# Patient Record
Sex: Female | Born: 1960
Health system: Southern US, Community
[De-identification: ages and names within clinical notes are randomized; demographics above are authoritative.]

## PROBLEM LIST (undated history)

## (undated) DIAGNOSIS — R519 Headache, unspecified: Secondary | ICD-10-CM

## (undated) DIAGNOSIS — Z780 Asymptomatic menopausal state: Secondary | ICD-10-CM

## (undated) DIAGNOSIS — R51 Headache: Secondary | ICD-10-CM

## (undated) DIAGNOSIS — S83249A Other tear of medial meniscus, current injury, unspecified knee, initial encounter: Secondary | ICD-10-CM

## (undated) DIAGNOSIS — G4700 Insomnia, unspecified: Secondary | ICD-10-CM

## (undated) DIAGNOSIS — Z973 Presence of spectacles and contact lenses: Secondary | ICD-10-CM

## (undated) DIAGNOSIS — T7840XA Allergy, unspecified, initial encounter: Secondary | ICD-10-CM

## (undated) DIAGNOSIS — E785 Hyperlipidemia, unspecified: Secondary | ICD-10-CM

## (undated) HISTORY — DX: Hyperlipidemia, unspecified: E78.5

## (undated) HISTORY — PX: COLONOSCOPY: SHX174

## (undated) HISTORY — DX: Allergy, unspecified, initial encounter: T78.40XA

## (undated) HISTORY — DX: Asymptomatic menopausal state: Z78.0

---
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
## 1982-09-11 HISTORY — PX: BUNIONECTOMY: SHX129

## 1999-03-24 ENCOUNTER — Other Ambulatory Visit: Admission: RE | Admit: 1999-03-24 | Discharge: 1999-03-24 | Payer: Self-pay | Admitting: Obstetrics and Gynecology

## 2000-01-12 ENCOUNTER — Encounter: Admission: RE | Admit: 2000-01-12 | Discharge: 2000-01-12 | Payer: Self-pay | Admitting: Urology

## 2000-01-12 ENCOUNTER — Encounter: Payer: Self-pay | Admitting: Urology

## 2000-05-03 ENCOUNTER — Other Ambulatory Visit: Admission: RE | Admit: 2000-05-03 | Discharge: 2000-05-03 | Payer: Self-pay | Admitting: Obstetrics and Gynecology

## 2001-05-09 ENCOUNTER — Other Ambulatory Visit: Admission: RE | Admit: 2001-05-09 | Discharge: 2001-05-09 | Payer: Self-pay | Admitting: Obstetrics and Gynecology

## 2002-11-04 ENCOUNTER — Other Ambulatory Visit: Admission: RE | Admit: 2002-11-04 | Discharge: 2002-11-04 | Payer: Self-pay | Admitting: Obstetrics and Gynecology

## 2003-11-24 ENCOUNTER — Other Ambulatory Visit: Admission: RE | Admit: 2003-11-24 | Discharge: 2003-11-24 | Payer: Self-pay | Admitting: Obstetrics and Gynecology

## 2005-03-21 ENCOUNTER — Other Ambulatory Visit: Admission: RE | Admit: 2005-03-21 | Discharge: 2005-03-21 | Payer: Self-pay | Admitting: Obstetrics and Gynecology

## 2008-09-08 ENCOUNTER — Ambulatory Visit (HOSPITAL_COMMUNITY): Admission: RE | Admit: 2008-09-08 | Discharge: 2008-09-08 | Payer: Self-pay | Admitting: Obstetrics and Gynecology

## 2009-04-18 ENCOUNTER — Emergency Department (HOSPITAL_COMMUNITY): Admission: EM | Admit: 2009-04-18 | Discharge: 2009-04-18 | Payer: Self-pay | Admitting: Emergency Medicine

## 2009-11-18 ENCOUNTER — Ambulatory Visit: Payer: Self-pay | Admitting: Vascular Surgery

## 2010-10-02 ENCOUNTER — Encounter: Payer: Self-pay | Admitting: Obstetrics and Gynecology

## 2011-01-24 NOTE — Assessment & Plan Note (Signed)
OFFICE VISIT   PILAR, WESTERGAARD  DOB:  1961-04-14                                       11/18/2009  [id number]#:[id number]   I briefly saw the patient in the office today along with Clementeen Hoof to  evaluate her varicose veins of both lower extremities.  She had some  small varicose veins bilaterally and particularly on the right leg some  of her varicosities appeared to be under elevated venous pressure.  She  had varicose vein in particular on the anterior lateral aspect of her  right leg which was quite distended.  I felt that there was evidence  that she may have reflux in the saphenous vein contributing to this and  therefore we obtained a venous duplex scan to look for reflux.  Venous  duplex scan did not show any evidence of reflux in the deep system.  In  addition there was no evidence of DVT.  Likewise the saphenous veins and  lesser saphenous veins appeared to be competent.  These results were  discussed with the patient.  She has undergone sclerotherapy today and  will be followed closely in the vein center.     Di Kindle. Edilia Bo, M.D.  Electronically Signed   CSD/MEDQ  D:  11/18/2009  T:  11/19/2009  Job:  [id number]

## 2011-01-24 NOTE — Procedures (Signed)
LOWER EXTREMITY VENOUS REFLUX EXAM   INDICATION:  Lower extremity varicose veins.   EXAM:  Using color-flow imaging and pulse Doppler spectral analysis, the  bilateral common femoral, superficial femoral, popliteal, posterior  tibial, greater and lesser saphenous veins were evaluated.  There is no  evidence suggesting deep venous insufficiency in the bilateral lower  extremities.   The bilateral saphenofemoral junctions and greater saphenous veins are  competent.   The bilateral proximal short saphenous veins demonstrate competency.   GSV Diameter (used if found to be incompetent only)                                            Right    Left  Proximal Greater Saphenous Vein           cm       cm  Proximal-to-mid-thigh                     cm       cm  Mid thigh                                 cm       cm  Mid-distal thigh                          cm       cm  Distal thigh                              cm       cm  Knee                                      cm       cm   IMPRESSION:  1. No reflux is noted in the bilateral greater and lesser saphenous      veins.  2. The bilateral greater saphenous veins are not tortuous.  3. The deep venous systems are competent.   ___________________________________________  Quita Skye. Hart Rochester, M.D.   CH/MEDQ  D:  11/22/2009  T:  11/22/2009  Job:  [id number]

## 2011-07-11 ENCOUNTER — Encounter: Payer: Self-pay | Admitting: Internal Medicine

## 2011-07-11 ENCOUNTER — Ambulatory Visit (INDEPENDENT_AMBULATORY_CARE_PROVIDER_SITE_OTHER): Payer: 59 | Admitting: Internal Medicine

## 2011-07-11 DIAGNOSIS — G4700 Insomnia, unspecified: Secondary | ICD-10-CM

## 2011-07-11 DIAGNOSIS — N951 Menopausal and female climacteric states: Secondary | ICD-10-CM

## 2011-07-11 DIAGNOSIS — Z780 Asymptomatic menopausal state: Secondary | ICD-10-CM

## 2011-07-11 MED ORDER — ZOLPIDEM TARTRATE 10 MG PO TABS: 10.0000 mg | ORAL_TABLET | Freq: Every evening | ORAL | Status: DC | PRN

## 2011-07-11 MED ORDER — CLONIDINE HCL 0.1 MG PO TABS: ORAL_TABLET | ORAL | Status: AC

## 2011-07-11 NOTE — Patient Instructions (Signed)
Take clonidine at bedtime,  Get out of bed slowly the first week on this medication  See me in January 2013

## 2011-07-12 ENCOUNTER — Telehealth: Payer: Self-pay | Admitting: Internal Medicine

## 2011-07-12 ENCOUNTER — Encounter: Payer: Self-pay | Admitting: Internal Medicine

## 2011-07-12 DIAGNOSIS — Z780 Asymptomatic menopausal state: Secondary | ICD-10-CM | POA: Insufficient documentation

## 2011-07-12 DIAGNOSIS — N951 Menopausal and female climacteric states: Secondary | ICD-10-CM | POA: Insufficient documentation

## 2011-07-12 DIAGNOSIS — T7840XA Allergy, unspecified, initial encounter: Secondary | ICD-10-CM | POA: Insufficient documentation

## 2011-07-12 DIAGNOSIS — E785 Hyperlipidemia, unspecified: Secondary | ICD-10-CM | POA: Insufficient documentation

## 2011-07-12 NOTE — Telephone Encounter (Signed)
Gavin Pound,  Please copy my note to sent to Dr. Timothy Lasso and place on my desk so I can write note to him  Thanks

## 2011-07-12 NOTE — Telephone Encounter (Signed)
Printed, placed on DDS desk

## 2011-07-12 NOTE — Progress Notes (Signed)
Subjective:    Patient ID: Molly Vance, female    DOB: 09-Jan-1961, 50 y.o.   MRN: [id number]  HPI  New pt here for consult regarding significant menopausal symptoms.  Primary MD.  Dr Timothy Lasso, Gyn:  Dr. Arelia Sneddon.  PMH of allergic rhinitis, mild hypelipdemia and significant menopausal symtpoms of multiple day and night-time vasomotor flushes, difficulty sleeping at times due to night-time flushes,  Low libido, weight gain and memory difficulties.  She currently is on Estrace vaginal creme twice weekly and recently given testosterone creme by Dr. Arelia Sneddon for decreased libido.  She feels the testosterone has helped minimally.  She reports no personal history of DVT, PE or hypercoagulable state,  She has a sister with breast CA,   BRCA status unknown, no personal or FH of ovarian or uterine CA,  Mother has Htn but no personal of FH MI. She has multiple questions if it is safe for her to take bio-identical HT and questions about hormone pellets.  She has tried OTC black cohosh, estroven, for flushes,  And melatonin and valerien for sleep all to no avail.  She does state the low dose Ambie (5 mg) that she takes for sleep is quite helpful as she will only wake up maybe one time per night  Allergies  Allergen Reactions  . Augmentin Hives  . Cefzil Swelling    lips  . Septra (Bactrim) Swelling    lips  . Neosporin (Neomycin-Polymyx-Gramicid) Rash    redness  . Polysporin (Bacitracin-Polymyxin B) Rash    redness   Past Medical History  Diagnosis Date  . Menopause   . Allergy   . Hyperlipidemia    Past Surgical History  Procedure Date  . Cesarean section 09/23/88  . Tonsillectomy 1983  . Bunionectomy 1984   History   Social History  . Marital Status: Married    Spouse Name: N/A    Number of Children: N/A  . Years of Education: N/A   Occupational History  . Not on file.   Social History Main Topics  . Smoking status: Never Smoker   . Smokeless tobacco: Never Used  . Alcohol Use:  3.6 oz/week    6 Glasses of wine per week  . Drug Use: No  . Sexually Active: Yes   Other Topics Concern  . Not on file   Social History Narrative  . No narrative on file   Family History  Problem Relation Age of Onset  . Diabetes Mother   . Hypertension Mother   . Diabetes Sister   . Diabetes Maternal Grandmother   . Breast cancer Sister   . Ulcerative colitis Sister   . Crohn's disease Sister    Patient Active Problem List  Diagnoses  . Menopause  . Allergy  . Hyperlipidemia  . Menopausal hot flushes   No current outpatient prescriptions on file prior to visit.        Review of Systems    see HPI Objective:   Physical Exam  Physical Exam  Nursing note and vitals reviewed.  Constitutional: She is oriented to person, place, and time. She appears well-developed and well-nourished.  HENT:  Head: Normocephalic and atraumatic.  Cardiovascular: Normal rate and regular rhythm. Exam reveals no gallop and no friction rub.  No murmur heard.  Pulmonary/Chest: Breath sounds normal. She has no wheezes. She has no rales.  Neurological: She is alert and oriented to person, place, and time.  Skin: Skin is warm and dry.  Psychiatric: She has a  normal mood and affect. Her behavior is normal.  Ext:  No edema        Assessment & Plan:  1)  Significant menopausal symptoms with hot flushes and insomnia:  Lengthy discussion with her regarding risks/benefits of HT and bioidenticals.  I gave her risk/benefit sheet for her to take home.  There are no comaparison RCT of first degree relatives with breast CA and the risk of cancer in individual using HT.  Breast Cancer is pt's biggest fear.  I counseled it is reasonable to start with non HT options, Effexor, Lexapro or clonidine and let her think about risking HT .  She does not wish to try antidepressant as she already has low libido.  Will Rx Clonidine 0.1 mg nightly  Counseled to arise slowly out of bed in the am for the first  week on therapy.  Pt aware that Clonidine is not FDA approved for hot flushes.  Recheck with me in 3 months,  If clonidine not effective will re-address very short term HT.    Continue Estrace vaginally for now.  Discuss with GYN when to discontinue   2)  Insomnia  Rx Ambien  10 mg #30  Pt take 5 mg only 2-3 times per week  I spent 45 mins with this pt  CC  Dr. Emmit Alexanders

## 2011-07-14 NOTE — Telephone Encounter (Signed)
Note faxed to Dr. Ferd Hibbs office

## 2011-07-19 ENCOUNTER — Encounter: Payer: Self-pay | Admitting: Internal Medicine

## 2011-09-26 ENCOUNTER — Ambulatory Visit: Payer: 59 | Admitting: Internal Medicine

## 2011-10-24 ENCOUNTER — Encounter: Payer: Self-pay | Admitting: *Deleted

## 2011-10-25 ENCOUNTER — Ambulatory Visit (INDEPENDENT_AMBULATORY_CARE_PROVIDER_SITE_OTHER): Payer: 59 | Admitting: *Deleted

## 2011-10-25 ENCOUNTER — Encounter: Payer: Self-pay | Admitting: *Deleted

## 2011-10-25 DIAGNOSIS — I781 Nevus, non-neoplastic: Secondary | ICD-10-CM

## 2011-10-25 NOTE — Progress Notes (Signed)
X=.3% Sotradecol administered with a 27g butterfly.  Patient received a total of 12cc.  Retreated this nice lady. About 50% of previously treated veins were still present. Only treated the spiders and did not treat the reticulars this time. Easy access and anticipate good results. Follow prn.  Photos: archived  Compression stockings applied: yes

## 2012-10-29 ENCOUNTER — Ambulatory Visit (INDEPENDENT_AMBULATORY_CARE_PROVIDER_SITE_OTHER): Payer: 59 | Admitting: Internal Medicine

## 2012-10-29 DIAGNOSIS — Z23 Encounter for immunization: Secondary | ICD-10-CM

## 2012-10-29 DIAGNOSIS — Z789 Other specified health status: Secondary | ICD-10-CM

## 2012-10-29 MED ORDER — MUPIROCIN CALCIUM 2 % EX CREA: TOPICAL_CREAM | Freq: Three times a day (TID) | CUTANEOUS | Status: DC

## 2012-10-29 MED ORDER — CIPROFLOXACIN HCL 500 MG PO TABS: 500.0000 mg | ORAL_TABLET | Freq: Two times a day (BID) | ORAL | Status: DC

## 2012-10-29 MED ORDER — ATOVAQUONE-PROGUANIL HCL 250-100 MG PO TABS: 1.0000 | ORAL_TABLET | Freq: Every day | ORAL | Status: DC

## 2012-10-29 NOTE — Progress Notes (Signed)
[facility] NOTE  RFV: vacation to [place] Subjective:    Patient ID: Molly Vance, female    DOB: 1961/02/04, 52 y.o.   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 All inclusive resort. No plans for adventure travel.    Review of Systems     Objective:   Physical Exam        Assessment & Plan:  Pre-travel vaccinations = hep A  Malaria prophylaxis= likely low risk at area of resort, patient interested in taking prophylaxis. Will give rx for malarone. And mosquito bite prevention.  Traveler's diarrhea = gave travel tips sheet, and rx for cipro

## 2013-11-04 ENCOUNTER — Encounter: Payer: Self-pay | Admitting: *Deleted

## 2013-11-05 ENCOUNTER — Ambulatory Visit (INDEPENDENT_AMBULATORY_CARE_PROVIDER_SITE_OTHER): Payer: Self-pay | Admitting: *Deleted

## 2013-11-05 DIAGNOSIS — I781 Nevus, non-neoplastic: Secondary | ICD-10-CM

## 2013-11-05 NOTE — Progress Notes (Signed)
X=.5% Asclera administered with a 27g butterfly.  Patient received a total of 9cc.  Cutaneous Laser:pulsed mode  810j/cm2  400 ms delay  13ms duration  0.5 spot  Total pulses: 801 Total energy 1.268  Total time:0:10  Photos: no  Compression stockings applied: yes  Cleaned up remaining blushes from previously treated areas using a combo of sclero and CL. Tol well. Follow prn. If her veins worsen, she should have another [place] and MD visit.

## 2013-12-23 ENCOUNTER — Encounter: Payer: Self-pay | Admitting: Gastroenterology

## 2014-02-03 ENCOUNTER — Ambulatory Visit (AMBULATORY_SURGERY_CENTER): Payer: Self-pay | Admitting: *Deleted

## 2014-02-03 VITALS — Ht 65.5 in | Wt 151.8 lb

## 2014-02-03 DIAGNOSIS — Z1211 Encounter for screening for malignant neoplasm of colon: Secondary | ICD-10-CM

## 2014-02-03 MED ORDER — MOVIPREP 100 G PO SOLR: ORAL | Status: DC

## 2014-02-03 NOTE — Progress Notes (Signed)
No allergies to eggs or soy. No problems with anesthesia.  Pt given Emmi instructions for colonoscopy  No oxygen use  No diet drug use  

## 2014-02-04 ENCOUNTER — Encounter: Payer: 59 | Admitting: Gastroenterology

## 2014-02-08 ENCOUNTER — Encounter: Payer: Self-pay | Admitting: Gastroenterology

## 2014-02-09 ENCOUNTER — Encounter: Payer: 59 | Admitting: Gastroenterology

## 2014-02-16 ENCOUNTER — Encounter: Payer: Self-pay | Admitting: Gastroenterology

## 2014-02-16 ENCOUNTER — Ambulatory Visit (AMBULATORY_SURGERY_CENTER): Payer: 59 | Admitting: Gastroenterology

## 2014-02-16 VITALS — BP 127/77 | HR 80 | Temp 97.3°F | Resp 16 | Ht 65.0 in | Wt 151.0 lb

## 2014-02-16 DIAGNOSIS — Z1211 Encounter for screening for malignant neoplasm of colon: Secondary | ICD-10-CM

## 2014-02-16 DIAGNOSIS — K5730 Diverticulosis of large intestine without perforation or abscess without bleeding: Secondary | ICD-10-CM

## 2014-02-16 MED ORDER — SODIUM CHLORIDE 0.9 % IV SOLN: 500.0000 mL | INTRAVENOUS | Status: DC

## 2014-02-16 NOTE — Patient Instructions (Signed)
YOU HAD AN ENDOSCOPIC PROCEDURE TODAY AT THE [facility]: Refer to the procedure report that was given to you for any specific questions about what was found during the examination.  If the procedure report does not answer your questions, please call your gastroenterologist to clarify.  If you requested that your care partner not be given the details of your procedure findings, then the procedure report has been included in a sealed envelope for you to review at your convenience later.  YOU SHOULD EXPECT: Some feelings of bloating in the abdomen. Passage of more gas than usual.  Walking can help get rid of the air that was put into your GI tract during the procedure and reduce the bloating. If you had a lower endoscopy (such as a colonoscopy or flexible sigmoidoscopy) you may notice spotting of blood in your stool or on the toilet paper. If you underwent a bowel prep for your procedure, then you may not have a normal bowel movement for a few days.  DIET: Your first meal following the procedure should be a light meal and then it is ok to progress to your normal diet.  A half-sandwich or bowl of soup is an example of a good first meal.  Heavy or fried foods are harder to digest and may make you feel nauseous or bloated.  Likewise meals heavy in dairy and vegetables can cause extra gas to form and this can also increase the bloating.  Drink plenty of fluids but you should avoid alcoholic beverages for 24 hours.  ACTIVITY: Your care partner should take you home directly after the procedure.  You should plan to take it easy, moving slowly for the rest of the day.  You can resume normal activity the day after the procedure however you should NOT DRIVE or use heavy machinery for 24 hours (because of the sedation medicines used during the test).    SYMPTOMS TO REPORT IMMEDIATELY: A gastroenterologist can be reached at any hour.  During normal business hours, 8:30 AM to 5:00 PM Monday through Friday,  call [phone].  After hours and on weekends, please call the GI answering service at [phone] who will take a message and have the physician on call contact you.   Following lower endoscopy (colonoscopy or flexible sigmoidoscopy):  Excessive amounts of blood in the stool  Significant tenderness or worsening of abdominal pains  Swelling of the abdomen that is new, acute  Fever of 100F or higher    FOLLOW UP: If any biopsies were taken you will be contacted by phone or by letter within the next 1-3 weeks.  Call your gastroenterologist if you have not heard about the biopsies in 3 weeks.  Our staff will call the home number listed on your records the next business day following your procedure to check on you and address any questions or concerns that you may have at that time regarding the information given to you following your procedure. This is a courtesy call and so if there is no answer at the home number and we have not heard from you through the emergency physician on call, we will assume that you have returned to your regular daily activities without incident.  SIGNATURES/CONFIDENTIALITY: You and/or your care partner have signed paperwork which will be entered into your electronic medical record.  These signatures attest to the fact that that the information above on your After Visit Summary has been reviewed and is understood.  Full responsibility of the confidentiality   of this discharge information lies with you and/or your care-partner.   Diverticulosis information given;  Next colonoscopy 10 years-2025

## 2014-02-16 NOTE — Op Note (Signed)
[place]  [address] [place], [address]   COLONOSCOPY PROCEDURE REPORT  PATIENT: Molly, Vance  MR#: [id number] BIRTHDATE: [birth year] , 52  yrs. old GENDER: Female ENDOSCOPIST: Milus Banister, MD REFERRED DT:HYHO Virgina Jock, M.D. PROCEDURE DATE:  02/16/2014 PROCEDURE:   Colonoscopy, screening First Screening Colonoscopy - Avg.  risk and is 50 yrs.  old or older Yes.  Prior Negative Screening - Now for repeat screening. N/A  History of Adenoma - Now for follow-up colonoscopy & has been > or = to 3 yrs.  N/A  Polyps Removed Today? No.  Recommend repeat exam, <10 yrs? No. ASA CLASS:   Class II INDICATIONS:average risk screening. MEDICATIONS: MAC sedation, administered by CRNA and Propofol (Diprivan) 370 mg IV  DESCRIPTION OF PROCEDURE:   After the risks benefits and alternatives of the procedure were thoroughly explained, informed consent was obtained.  A digital rectal exam revealed no abnormalities of the rectum.   The LB [id number]-H190 [id number]  endoscope was introduced through the anus and advanced to the cecum, which was identified by both the appendix and ileocecal valve. No adverse events experienced.   The quality of the prep was excellent.  The instrument was then slowly withdrawn as the colon was fully examined.  COLON FINDINGS: There were a few diverticulum in the left colon. The examination was otherwise normal.  Retroflexed views revealed no abnormalities. The time to cecum=3 minutes 28 seconds. Withdrawal time=7 minutes 13 seconds.  The scope was withdrawn and the procedure completed. COMPLICATIONS: There were no complications.  ENDOSCOPIC IMPRESSION: There were a few diverticulum in the left colon. The examination was otherwise normal. No polyps or cancers  RECOMMENDATIONS: You should continue to follow colorectal cancer screening guidelines for "routine risk" patients with a repeat colonoscopy in 10 years. There is no need for annual colon  cancer screening with stool cards prior to then.   eSigned:  Milus Banister, MD 02/16/2014 10:06 AM

## 2014-02-16 NOTE — Progress Notes (Signed)
A/ox3 pleased with MAC, report to Jane RN 

## 2014-02-17 ENCOUNTER — Telehealth: Payer: Self-pay

## 2014-02-17 NOTE — Telephone Encounter (Signed)
  Follow up Call-  Call back number 02/16/2014  Post procedure Call Back phone  # [phone]  Permission to leave phone message Yes     Patient questions:  Do you have a fever, pain , or abdominal swelling? no Pain Score  0 *  Have you tolerated food without any problems? yes  Have you been able to return to your normal activities? yes  Do you have any questions about your discharge instructions: Diet   no Medications  no Follow up visit  no  Do you have questions or concerns about your Care? no  Actions: * If pain score is 4 or above: No action needed, pain <4.

## 2014-06-26 ENCOUNTER — Other Ambulatory Visit: Payer: Self-pay

## 2014-07-13 ENCOUNTER — Encounter: Payer: Self-pay | Admitting: Gastroenterology

## 2014-09-28 ENCOUNTER — Other Ambulatory Visit (HOSPITAL_COMMUNITY): Payer: Self-pay | Admitting: Orthopedic Surgery

## 2014-09-28 DIAGNOSIS — M25561 Pain in right knee: Secondary | ICD-10-CM

## 2014-09-30 ENCOUNTER — Ambulatory Visit (HOSPITAL_BASED_OUTPATIENT_CLINIC_OR_DEPARTMENT_OTHER)
Admission: RE | Admit: 2014-09-30 | Discharge: 2014-09-30 | Disposition: A | Discharge status: Home / Self Care | Payer: 59 | Source: Ambulatory Visit | Attending: Orthopedic Surgery | Admitting: Orthopedic Surgery

## 2014-09-30 DIAGNOSIS — S83241A Other tear of medial meniscus, current injury, right knee, initial encounter: Secondary | ICD-10-CM | POA: Insufficient documentation

## 2014-09-30 DIAGNOSIS — M25561 Pain in right knee: Secondary | ICD-10-CM | POA: Insufficient documentation

## 2014-09-30 DIAGNOSIS — X58XXXA Exposure to other specified factors, initial encounter: Secondary | ICD-10-CM | POA: Insufficient documentation

## 2014-10-12 ENCOUNTER — Ambulatory Visit (HOSPITAL_COMMUNITY): Payer: 59

## 2014-10-13 ENCOUNTER — Other Ambulatory Visit (HOSPITAL_COMMUNITY): Payer: Self-pay | Admitting: Orthopedic Surgery

## 2014-10-22 ENCOUNTER — Other Ambulatory Visit (HOSPITAL_COMMUNITY): Payer: 59

## 2014-10-26 ENCOUNTER — Encounter (HOSPITAL_COMMUNITY)
Admission: RE | Admit: 2014-10-26 | Discharge: 2014-10-26 | Disposition: A | Discharge status: Home / Self Care | Payer: 59 | Source: Ambulatory Visit | Attending: Orthopedic Surgery | Admitting: Orthopedic Surgery

## 2014-10-26 ENCOUNTER — Encounter (HOSPITAL_COMMUNITY): Payer: Self-pay

## 2014-10-26 DIAGNOSIS — M67461 Ganglion, right knee: Secondary | ICD-10-CM | POA: Diagnosis not present

## 2014-10-26 DIAGNOSIS — E785 Hyperlipidemia, unspecified: Secondary | ICD-10-CM | POA: Diagnosis not present

## 2014-10-26 DIAGNOSIS — S83241A Other tear of medial meniscus, current injury, right knee, initial encounter: Secondary | ICD-10-CM | POA: Diagnosis not present

## 2014-10-26 DIAGNOSIS — Y939 Activity, unspecified: Secondary | ICD-10-CM | POA: Diagnosis not present

## 2014-10-26 DIAGNOSIS — Y999 Unspecified external cause status: Secondary | ICD-10-CM | POA: Diagnosis not present

## 2014-10-26 DIAGNOSIS — Y929 Unspecified place or not applicable: Secondary | ICD-10-CM | POA: Diagnosis not present

## 2014-10-26 DIAGNOSIS — G4700 Insomnia, unspecified: Secondary | ICD-10-CM | POA: Diagnosis not present

## 2014-10-26 DIAGNOSIS — X58XXXA Exposure to other specified factors, initial encounter: Secondary | ICD-10-CM | POA: Diagnosis not present

## 2014-10-26 DIAGNOSIS — Z79899 Other long term (current) drug therapy: Secondary | ICD-10-CM | POA: Diagnosis not present

## 2014-10-26 DIAGNOSIS — M23041 Cystic meniscus, anterior horn of lateral meniscus, right knee: Secondary | ICD-10-CM | POA: Diagnosis not present

## 2014-10-26 DIAGNOSIS — M25561 Pain in right knee: Secondary | ICD-10-CM | POA: Diagnosis present

## 2014-10-26 HISTORY — DX: Headache: R51

## 2014-10-26 HISTORY — DX: Headache, unspecified: R51.9

## 2014-10-26 HISTORY — DX: Presence of spectacles and contact lenses: Z97.3

## 2014-10-26 HISTORY — DX: Insomnia, unspecified: G47.00

## 2014-10-26 LAB — BASIC METABOLIC PANEL
ANION GAP: 5 (ref 5–15)
BUN: 11 mg/dL (ref 6–23)
CHLORIDE: 106 mmol/L (ref 96–112)
CO2: 28 mmol/L (ref 19–32)
Calcium: 9.4 mg/dL (ref 8.4–10.5)
Creatinine, Ser: 0.68 mg/dL (ref 0.50–1.10)
GFR calc Af Amer: >90 mL/min (ref >90)
GFR calc non Af Amer: >90 mL/min (ref >90)
Glucose, Bld: 109 mg/dL — ABNORMAL HIGH (ref 70–99)
POTASSIUM: 4.6 mmol/L (ref 3.5–5.1)
SODIUM: 139 mmol/L (ref 135–145)

## 2014-10-26 LAB — CBC
HCT: 39.6 % (ref 36.0–46.0)
HEMOGLOBIN: 13.4 g/dL (ref 12.0–15.0)
MCH: 31.2 pg (ref 26.0–34.0)
MCHC: 33.8 g/dL (ref 30.0–36.0)
MCV: 92.1 fL (ref 78.0–100.0)
PLATELETS: 254 10*3/uL (ref 150–400)
RBC: 4.3 MIL/uL (ref 3.87–5.11)
RDW: 12.5 % (ref 11.5–15.5)
WBC: 3.4 10*3/uL — AB (ref 4.0–10.5)

## 2014-10-26 MED ORDER — CLINDAMYCIN PHOSPHATE 900 MG/50ML IV SOLN
900.0000 mg | INTRAVENOUS | Status: AC
  Administered 2014-10-27: 900 mg via INTRAVENOUS
  Filled 2014-10-26: qty 50

## 2014-10-26 NOTE — Progress Notes (Signed)
Pt denies SOB, chest pain, and being under the care of a cardiologist. Pt denies having a chest x ray and EKG within the last year. Pt denies having a stress test, echo and cardiac cath.

## 2014-10-27 ENCOUNTER — Ambulatory Visit (HOSPITAL_COMMUNITY): Payer: 59 | Admitting: Certified Registered Nurse Anesthetist

## 2014-10-27 ENCOUNTER — Ambulatory Visit (HOSPITAL_COMMUNITY)
Admission: RE | Admit: 2014-10-27 | Discharge: 2014-10-27 | Disposition: A | Discharge status: Home / Self Care | Payer: 59 | Source: Ambulatory Visit | Attending: Orthopedic Surgery | Admitting: Orthopedic Surgery

## 2014-10-27 ENCOUNTER — Encounter (HOSPITAL_COMMUNITY): Payer: Self-pay | Admitting: *Deleted

## 2014-10-27 ENCOUNTER — Encounter (HOSPITAL_COMMUNITY)
Admission: RE | Disposition: A | Discharge status: Home / Self Care | Payer: Self-pay | Source: Ambulatory Visit | Attending: Orthopedic Surgery

## 2014-10-27 DIAGNOSIS — S83206A Unspecified tear of unspecified meniscus, current injury, right knee, initial encounter: Secondary | ICD-10-CM

## 2014-10-27 DIAGNOSIS — S83241A Other tear of medial meniscus, current injury, right knee, initial encounter: Secondary | ICD-10-CM | POA: Diagnosis not present

## 2014-10-27 DIAGNOSIS — Z79899 Other long term (current) drug therapy: Secondary | ICD-10-CM | POA: Insufficient documentation

## 2014-10-27 DIAGNOSIS — Y929 Unspecified place or not applicable: Secondary | ICD-10-CM | POA: Insufficient documentation

## 2014-10-27 DIAGNOSIS — Y999 Unspecified external cause status: Secondary | ICD-10-CM | POA: Insufficient documentation

## 2014-10-27 DIAGNOSIS — Y939 Activity, unspecified: Secondary | ICD-10-CM | POA: Insufficient documentation

## 2014-10-27 DIAGNOSIS — E785 Hyperlipidemia, unspecified: Secondary | ICD-10-CM | POA: Insufficient documentation

## 2014-10-27 DIAGNOSIS — X58XXXA Exposure to other specified factors, initial encounter: Secondary | ICD-10-CM | POA: Insufficient documentation

## 2014-10-27 DIAGNOSIS — M23041 Cystic meniscus, anterior horn of lateral meniscus, right knee: Secondary | ICD-10-CM | POA: Insufficient documentation

## 2014-10-27 DIAGNOSIS — M67461 Ganglion, right knee: Secondary | ICD-10-CM | POA: Insufficient documentation

## 2014-10-27 DIAGNOSIS — G4700 Insomnia, unspecified: Secondary | ICD-10-CM | POA: Insufficient documentation

## 2014-10-27 HISTORY — PX: KNEE ARTHROSCOPY WITH MEDIAL MENISECTOMY: SHX5651

## 2014-10-27 SURGERY — ARTHROSCOPY, KNEE, WITH MEDIAL MENISCECTOMY
Anesthesia: Regional | Site: Knee | Laterality: Right

## 2014-10-27 MED ORDER — FENTANYL CITRATE 0.05 MG/ML IJ SOLN
INTRAMUSCULAR | Status: AC
  Filled 2014-10-27: qty 2

## 2014-10-27 MED ORDER — ROCURONIUM BROMIDE 50 MG/5ML IV SOLN
INTRAVENOUS | Status: AC
  Filled 2014-10-27: qty 1

## 2014-10-27 MED ORDER — KETOROLAC TROMETHAMINE 30 MG/ML IJ SOLN
INTRAMUSCULAR | Status: AC
  Filled 2014-10-27: qty 1

## 2014-10-27 MED ORDER — FENTANYL CITRATE 0.05 MG/ML IJ SOLN
INTRAMUSCULAR | Status: AC
Start: 2014-10-27 — End: 2014-10-27
  Filled 2014-10-27: qty 5

## 2014-10-27 MED ORDER — CLONIDINE HCL (ANALGESIA) 100 MCG/ML EP SOLN
150.0000 ug | EPIDURAL | Status: AC
  Administered 2014-10-27: 1 mL via INTRA_ARTICULAR
  Filled 2014-10-27: qty 1.5

## 2014-10-27 MED ORDER — ROCURONIUM BROMIDE 50 MG/5ML IV SOLN
INTRAVENOUS | Status: AC
  Filled 2014-10-27: qty 3

## 2014-10-27 MED ORDER — BUPIVACAINE-EPINEPHRINE (PF) 0.5% -1:200000 IJ SOLN
INTRAMUSCULAR | Status: DC | PRN
  Administered 2014-10-27: 20 mL via PERINEURAL

## 2014-10-27 MED ORDER — MIDAZOLAM HCL 2 MG/2ML IJ SOLN
INTRAMUSCULAR | Status: AC
  Filled 2014-10-27: qty 2

## 2014-10-27 MED ORDER — ONDANSETRON HCL 4 MG/2ML IJ SOLN
INTRAMUSCULAR | Status: AC
  Filled 2014-10-27: qty 2

## 2014-10-27 MED ORDER — OXYCODONE-ACETAMINOPHEN 5-325 MG PO TABS: 1.0000 | ORAL_TABLET | Freq: Four times a day (QID) | ORAL | Status: DC | PRN

## 2014-10-27 MED ORDER — CHLORHEXIDINE GLUCONATE 4 % EX LIQD
60.0000 mL | Freq: Once | CUTANEOUS | Status: DC
  Filled 2014-10-27: qty 60

## 2014-10-27 MED ORDER — NEOSTIGMINE METHYLSULFATE 10 MG/10ML IV SOLN
INTRAVENOUS | Status: AC
  Filled 2014-10-27: qty 1

## 2014-10-27 MED ORDER — FENTANYL CITRATE 0.05 MG/ML IJ SOLN
INTRAMUSCULAR | Status: DC | PRN
  Administered 2014-10-27 (×5): 50 ug via INTRAVENOUS

## 2014-10-27 MED ORDER — LACTATED RINGERS IV SOLN
INTRAVENOUS | Status: DC
  Administered 2014-10-27: 10:00:00 via INTRAVENOUS

## 2014-10-27 MED ORDER — SODIUM CHLORIDE 0.9 % IR SOLN
Status: DC | PRN
Start: 2014-10-27 — End: 2014-10-27
  Administered 2014-10-27 (×2): 3000 mL

## 2014-10-27 MED ORDER — PROPOFOL 10 MG/ML IV BOLUS
INTRAVENOUS | Status: DC | PRN
  Administered 2014-10-27: 170 mg via INTRAVENOUS

## 2014-10-27 MED ORDER — LACTATED RINGERS IV SOLN
INTRAVENOUS | Status: DC | PRN
  Administered 2014-10-27 (×2): via INTRAVENOUS

## 2014-10-27 MED ORDER — MORPHINE SULFATE 4 MG/ML IJ SOLN
INTRAMUSCULAR | Status: DC | PRN
  Administered 2014-10-27: 8 mg via INTRAVENOUS

## 2014-10-27 MED ORDER — ONDANSETRON HCL 4 MG/2ML IJ SOLN
INTRAMUSCULAR | Status: DC | PRN
  Administered 2014-10-27: 4 mg via INTRAVENOUS

## 2014-10-27 MED ORDER — EPHEDRINE SULFATE 50 MG/ML IJ SOLN
INTRAMUSCULAR | Status: AC
  Filled 2014-10-27: qty 1

## 2014-10-27 MED ORDER — ARTIFICIAL TEARS OP OINT
TOPICAL_OINTMENT | OPHTHALMIC | Status: AC
  Filled 2014-10-27: qty 3.5

## 2014-10-27 MED ORDER — BUPIVACAINE HCL (PF) 0.25 % IJ SOLN
INTRAMUSCULAR | Status: DC | PRN
Start: 2014-10-27 — End: 2014-10-27
  Administered 2014-10-27 (×2): 10 mL

## 2014-10-27 MED ORDER — PROPOFOL 10 MG/ML IV BOLUS
INTRAVENOUS | Status: AC
  Filled 2014-10-27: qty 20

## 2014-10-27 MED ORDER — CLINDAMYCIN PHOSPHATE 900 MG/50ML IV SOLN
INTRAVENOUS | Status: AC
  Filled 2014-10-27: qty 50

## 2014-10-27 MED ORDER — LIDOCAINE HCL (CARDIAC) 20 MG/ML IV SOLN
INTRAVENOUS | Status: AC
  Filled 2014-10-27: qty 5

## 2014-10-27 MED ORDER — KETOROLAC TROMETHAMINE 30 MG/ML IJ SOLN
30.0000 mg | Freq: Once | INTRAMUSCULAR | Status: AC
  Administered 2014-10-27: 30 mg via INTRAVENOUS

## 2014-10-27 MED ORDER — LIDOCAINE HCL (CARDIAC) 20 MG/ML IV SOLN
INTRAVENOUS | Status: DC | PRN
  Administered 2014-10-27: 70 mg via INTRAVENOUS

## 2014-10-27 MED ORDER — SODIUM CHLORIDE 0.9 % IJ SOLN
INTRAMUSCULAR | Status: AC
  Filled 2014-10-27: qty 10

## 2014-10-27 MED ORDER — MORPHINE SULFATE 4 MG/ML IJ SOLN
INTRAMUSCULAR | Status: AC
  Filled 2014-10-27: qty 2

## 2014-10-27 MED ORDER — GLYCOPYRROLATE 0.2 MG/ML IJ SOLN
INTRAMUSCULAR | Status: AC
  Filled 2014-10-27: qty 3

## 2014-10-27 MED ORDER — MIDAZOLAM HCL 5 MG/5ML IJ SOLN
INTRAMUSCULAR | Status: DC | PRN
  Administered 2014-10-27: 2 mg via INTRAVENOUS

## 2014-10-27 SURGICAL SUPPLY — 54 items
BANDAGE ELASTIC 6 VELCRO ST LF (GAUZE/BANDAGES/DRESSINGS) ×2 IMPLANT
BANDAGE ESMARK 6X9 LF (GAUZE/BANDAGES/DRESSINGS) IMPLANT
BLADE CUDA 5.5 (BLADE) IMPLANT
BLADE CUTTER GATOR 3.5 (BLADE) ×2 IMPLANT
BLADE GREAT WHITE 4.2 (BLADE) ×1 IMPLANT
BLADE GREAT WHITE 4.2MM (BLADE) ×1
BLADE SURG ROTATE 9660 (MISCELLANEOUS) IMPLANT
BNDG CMPR 9X6 STRL LF SNTH (GAUZE/BANDAGES/DRESSINGS) ×1
BNDG ESMARK 6X9 LF (GAUZE/BANDAGES/DRESSINGS) ×3
BUR OVAL 6.0 (BURR) IMPLANT
COVER SURGICAL LIGHT HANDLE (MISCELLANEOUS) ×3 IMPLANT
CUFF TOURNIQUET SINGLE 34IN LL (TOURNIQUET CUFF) ×2 IMPLANT
DRAPE ARTHROSCOPY W/POUCH 114 (DRAPES) ×3 IMPLANT
DRAPE INCISE IOBAN 66X45 STRL (DRAPES) IMPLANT
DRAPE PROXIMA HALF (DRAPES) IMPLANT
DRAPE U-SHAPE 47X51 STRL (DRAPES) ×3 IMPLANT
DURAPREP 26ML APPLICATOR (WOUND CARE) ×3 IMPLANT
GAUZE SPONGE 4X4 12PLY STRL (GAUZE/BANDAGES/DRESSINGS) IMPLANT
GAUZE XEROFORM 1X8 LF (GAUZE/BANDAGES/DRESSINGS) ×2 IMPLANT
GLOVE BIOGEL PI IND STRL 8 (GLOVE) ×1 IMPLANT
GLOVE BIOGEL PI INDICATOR 8 (GLOVE) ×2
GLOVE SURG ORTHO 8.0 STRL STRW (GLOVE) ×3 IMPLANT
GOWN STRL REUS W/ TWL LRG LVL3 (GOWN DISPOSABLE) ×2 IMPLANT
GOWN STRL REUS W/ TWL XL LVL3 (GOWN DISPOSABLE) ×1 IMPLANT
GOWN STRL REUS W/TWL LRG LVL3 (GOWN DISPOSABLE) ×6
GOWN STRL REUS W/TWL XL LVL3 (GOWN DISPOSABLE) ×3
KIT BASIN OR (CUSTOM PROCEDURE TRAY) ×3 IMPLANT
KIT ROOM TURNOVER OR (KITS) ×3 IMPLANT
MANIFOLD NEPTUNE II (INSTRUMENTS) IMPLANT
NDL 18GX1X1/2 (RX/OR ONLY) (NEEDLE) IMPLANT
NDL HYPO 25GX1X1/2 BEV (NEEDLE) ×1 IMPLANT
NEEDLE 18GX1X1/2 (RX/OR ONLY) (NEEDLE) IMPLANT
NEEDLE HYPO 25GX1X1/2 BEV (NEEDLE) ×3 IMPLANT
NS IRRIG 1000ML POUR BTL (IV SOLUTION) IMPLANT
PACK ARTHROSCOPY DSU (CUSTOM PROCEDURE TRAY) ×3 IMPLANT
PAD ARMBOARD 7.5X6 YLW CONV (MISCELLANEOUS) ×6 IMPLANT
PADDING CAST ABS 4INX4YD NS (CAST SUPPLIES) ×2
PADDING CAST ABS COTTON 4X4 ST (CAST SUPPLIES) IMPLANT
PADDING CAST COTTON 6X4 STRL (CAST SUPPLIES) ×3 IMPLANT
SET ARTHROSCOPY TUBING (MISCELLANEOUS) ×3
SET ARTHROSCOPY TUBING LN (MISCELLANEOUS) ×1 IMPLANT
SPONGE GAUZE 4X4 12PLY STER LF (GAUZE/BANDAGES/DRESSINGS) ×2 IMPLANT
SPONGE LAP 4X18 X RAY DECT (DISPOSABLE) ×3 IMPLANT
SUT ETHILON 3 0 PS 1 (SUTURE) IMPLANT
SUT MENISCAL KIT (KITS) IMPLANT
SYR 20ML ECCENTRIC (SYRINGE) ×3 IMPLANT
SYR CONTROL 10ML LL (SYRINGE) IMPLANT
SYR TB 1ML LUER SLIP (SYRINGE) ×3 IMPLANT
TOWEL OR 17X24 6PK STRL BLUE (TOWEL DISPOSABLE) ×3 IMPLANT
TOWEL OR 17X26 10 PK STRL BLUE (TOWEL DISPOSABLE) ×3 IMPLANT
TUBE CONNECTING 12'X1/4 (SUCTIONS) ×1
TUBE CONNECTING 12X1/4 (SUCTIONS) ×2 IMPLANT
WAND HAND CNTRL MULTIVAC 90 (MISCELLANEOUS) ×3 IMPLANT
WATER STERILE IRR 1000ML POUR (IV SOLUTION) ×3 IMPLANT

## 2014-10-27 NOTE — Brief Op Note (Signed)
10/27/2014  1:27 PM  PATIENT:  Molly Vance  54 y.o. female  PRE-OPERATIVE DIAGNOSIS:  RIGHT KNEE MEDIAL MENISCAL TEAR, CYST  POST-OPERATIVE DIAGNOSIS:  RIGHT KNEE MEDIAL MENISCAL TEAR, CYST  PROCEDURE:  Procedure(s): KNEE ARTHROSCOPY WITH PARTIAL MEDIAL MENISECTOMY, CYST DECOMPRESSION.  SURGEON:  Surgeon(s): Meredith Pel, MD  ASSISTANT: carla bethune rnfa  ANESTHESIA:   general  EBL: 10 ml    Total I/O In: 1300 [I.V.:1300] Out: -   BLOOD ADMINISTERED: none  DRAINS: none   LOCAL MEDICATIONS USED:  Marcaine morphine clonidine  SPECIMEN:  No Specimen  COUNTS:  YES  TOURNIQUET:   Total Tourniquet Time Documented: Thigh (Right) - 28 minutes Total: Thigh (Right) - 28 minutes   DICTATION: .Other Dictation: Dictation Number [id number]   PLAN OF CARE: Discharge to home after PACU  PATIENT DISPOSITION:  PACU - hemodynamically stable

## 2014-10-27 NOTE — Anesthesia Preprocedure Evaluation (Signed)
Anesthesia Evaluation  Patient identified by MRN, date of birth, ID band Patient awake    Reviewed: Allergy & Precautions, NPO status , Patient's Chart, lab work & pertinent test results  History of Anesthesia Complications Negative for: history of anesthetic complications  Airway Mallampati: I  TM Distance: >3 FB Neck ROM: Full    Dental  (+) Teeth Intact   Pulmonary neg pulmonary ROS,  breath sounds clear to auscultation        Cardiovascular negative cardio ROS  Rhythm:Regular     Neuro/Psych  Headaches, negative psych ROS   GI/Hepatic negative GI ROS, Neg liver ROS,   Endo/Other  negative endocrine ROS  Renal/GU negative Renal ROS     Musculoskeletal Right medial meniscus tear   Abdominal   Peds  Hematology negative hematology ROS (+)   Anesthesia Other Findings   Reproductive/Obstetrics                             Anesthesia Physical Anesthesia Plan  ASA: II  Anesthesia Plan: General and Regional   Post-op Pain Management:    Induction: Intravenous  Airway Management Planned: LMA  Additional Equipment: None  Intra-op Plan:   Post-operative Plan: Extubation in OR  Informed Consent: I have reviewed the patients History and Physical, chart, labs and discussed the procedure including the risks, benefits and alternatives for the proposed anesthesia with the patient or authorized representative who has indicated his/her understanding and acceptance.   Dental advisory given  Plan Discussed with: CRNA and Surgeon  Anesthesia Plan Comments:         Anesthesia Quick Evaluation

## 2014-10-27 NOTE — Discharge Instructions (Signed)
°What to eat: ° °For your first meals, you should eat lightly; only small meals initially.  If you do not have nausea, you may eat larger meals.  Avoid spicy, greasy and heavy food.   ° °General Anesthesia, Adult, Care After  °Refer to this sheet in the next few weeks. These instructions provide you with information on caring for yourself after your procedure. Your health care provider may also give you more specific instructions. Your treatment has been planned according to current medical practices, but problems sometimes occur. Call your health care provider if you have any problems or questions after your procedure.  °WHAT TO EXPECT AFTER THE PROCEDURE  °After the procedure, it is typical to experience:  °Sleepiness.  °Nausea and vomiting. °HOME CARE INSTRUCTIONS  °For the first 24 hours after general anesthesia:  °Have a responsible person with you.  °Do not drive a car. If you are alone, do not take public transportation.  °Do not drink alcohol.  °Do not take medicine that has not been prescribed by your health care provider.  °Do not sign important papers or make important decisions.  °You may resume a normal diet and activities as directed by your health care provider.  °Change bandages (dressings) as directed.  °If you have questions or problems that seem related to general anesthesia, call the hospital and ask for the anesthetist or anesthesiologist on call. °SEEK MEDICAL CARE IF:  °You have nausea and vomiting that continue the day after anesthesia.  °You develop a rash. °SEEK IMMEDIATE MEDICAL CARE IF:  °You have difficulty breathing.  °You have chest pain.  °You have any allergic problems. °Document Released: 12/04/2000 Document Revised: 04/30/2013 Document Reviewed: 03/13/2013  °ExitCare® Patient Information ©2014 [employer].  ° °Sore Throat  ° ° °A sore throat is a painful, burning, sore, or scratchy feeling of the throat. There may be pain or tenderness when swallowing or talking. You may have  other symptoms with a sore throat. These include coughing, sneezing, fever, or a swollen neck. A sore throat is often the first sign of another sickness. These sicknesses may include a cold, flu, strep throat, or an infection called mono. Most sore throats go away without medical treatment.  °HOME CARE  °Only take medicine as told by your doctor.  °Drink enough fluids to keep your pee (urine) clear or pale yellow.  °Rest as needed.  °Try using throat sprays, lozenges, or suck on hard candy (if older than 4 years or as told).  °Sip warm liquids, such as broth, herbal tea, or warm water with honey. Try sucking on frozen ice pops or drinking cold liquids.  °Rinse the mouth (gargle) with salt water. Mix 1 teaspoon salt with 8 ounces of water.  °Do not smoke. Avoid being around others when they are smoking.  °Put a humidifier in your bedroom at night to moisten the air. You can also turn on a hot shower and sit in the bathroom for 5-10 minutes. Be sure the bathroom door is closed. °GET HELP RIGHT AWAY IF:  °You have trouble breathing.  °You cannot swallow fluids, soft foods, or your spit (saliva).  °You have more puffiness (swelling) in the throat.  °Your sore throat does not get better in 7 days.  °You feel sick to your stomach (nauseous) and throw up (vomit).  °You have a fever or lasting symptoms for more than 2-3 days.  °You have a fever and your symptoms suddenly get worse. °MAKE SURE YOU:  °Understand these   instructions.  °Will watch your condition.  °Will get help right away if you are not doing well or get worse. °Document Released: 06/06/2008 Document Revised: 05/22/2012 Document Reviewed: 05/05/2012  °ExitCare® Patient Information ©2015 [employer]. This information is not intended to replace advice given to you by your health care provider. Make sure you discuss any questions you have with your health care provider.  ° ° ° °

## 2014-10-27 NOTE — H&P (Signed)
Molly Vance is an 54 y.o. female.   Chief Complaint: Right knee pain  HPI: Molly Vance is a 54 year old patient with right knee pain. She's had knee pain for several months. MRI scan shows meniscal tear and cyst. She's failed nonoperative management presents for operative management after expiration risk and benefits.  Past Medical History  Diagnosis Date  . Menopause   . Allergy   . Hyperlipidemia   . Wears glasses   . Headache   . Insomnia     Past Surgical History  Procedure Laterality Date  . Cesarean section  09/23/88  . Tonsillectomy  1983  . Bunionectomy  1984  . Colonoscopy      Family History  Problem Relation Age of Onset  . Diabetes Mother   . Hypertension Mother   . Diabetes Sister   . Diabetes Maternal Grandmother   . Breast cancer Sister   . Ulcerative colitis Sister   . Crohn's disease Sister   . Colon cancer Neg Hx    Social History:  reports that she has never smoked. She has never used smokeless tobacco. She reports that she drinks about 3.6 oz of alcohol per week. She reports that she does not use illicit drugs.  Allergies:  Allergies  Allergen Reactions  . Amoxicillin-Pot Clavulanate Hives  . Cefprozil Swelling    lips  . Septra [Bactrim] Swelling    lips  . Neosporin [Neomycin-Polymyxin-Gramicidin] Rash    redness  . Polysporin [Bacitracin-Polymyxin B] Rash    redness    Medications Prior to Admission  Medication Sig Dispense Refill  . Cholecalciferol (VITAMIN D3) 1000 UNITS CAPS Take 1 capsule by mouth daily.      . cycloSPORINE (RESTASIS) 0.05 % ophthalmic emulsion Place 1 drop into both eyes 2 (two) times daily.      Marland Kitchen estradiol (ESTRACE) 0.1 MG/GM vaginal cream Place 1 g vaginally 2 (two) times a week.      . fluticasone (FLONASE) 50 MCG/ACT nasal spray Place 2 sprays into the nose daily.      Marland Kitchen loratadine (CLARITIN) 10 MG tablet Take 10 mg by mouth daily.      . Magnesium 400 MG CAPS Take 1 tablet by mouth daily.      . Multiple Vitamin  (MULTIVITAMIN) tablet Take 1 tablet by mouth daily.      . nabumetone (RELAFEN) 500 MG tablet Take 500 mg by mouth 2 (two) times daily as needed for mild pain.    Marland Kitchen olopatadine (PATANOL) 0.1 % ophthalmic solution Place 1 drop into both eyes 2 (two) times daily.      . OMEGA 3 1000 MG CAPS Take 2 capsules by mouth daily.      Marland Kitchen zolpidem (AMBIEN) 5 MG tablet Take 5 mg by mouth at bedtime as needed for sleep.    Marland Kitchen zolpidem (AMBIEN) 10 MG tablet Take 1 tablet (10 mg total) by mouth at bedtime as needed for sleep. (Patient not taking: Reported on 10/22/2014) 30 tablet 0    Results for orders placed or performed during the hospital encounter of 10/26/14 (from the past 48 hour(s))  CBC     Status: Abnormal   Collection Time: 10/26/14  8:48 AM  Result Value Ref Range   WBC 3.4 (L) 4.0 - 10.5 K/uL   RBC 4.30 3.87 - 5.11 MIL/uL   Hemoglobin 13.4 12.0 - 15.0 g/dL   HCT 39.6 36.0 - 46.0 %   MCV 92.1 78.0 - 100.0 fL   MCH 31.2 26.0 -  34.0 pg   MCHC 33.8 30.0 - 36.0 g/dL   RDW 12.5 11.5 - 15.5 %   Platelets 254 150 - 400 K/uL  Basic metabolic panel     Status: Abnormal   Collection Time: 10/26/14  8:48 AM  Result Value Ref Range   Sodium 139 135 - 145 mmol/L   Potassium 4.6 3.5 - 5.1 mmol/L   Chloride 106 96 - 112 mmol/L   CO2 28 19 - 32 mmol/L   Glucose, Bld 109 (H) 70 - 99 mg/dL   BUN 11 6 - 23 mg/dL   Creatinine, Ser 0.68 0.50 - 1.10 mg/dL   Calcium 9.4 8.4 - 10.5 mg/dL   GFR calc non Af Amer >90 >90 mL/min   GFR calc Af Amer >90 >90 mL/min    Comment: (NOTE) The eGFR has been calculated using the CKD EPI equation. This calculation has not been validated in all clinical situations. eGFR's persistently <90 mL/min signify possible Chronic Kidney Disease.    Anion gap 5 5 - 15   No results found.  Review of Systems  Constitutional: Negative.   HENT: Negative.   Eyes: Negative.   Respiratory: Negative.   Cardiovascular: Negative.   Gastrointestinal: Negative.   Genitourinary:  Negative.   Musculoskeletal: Positive for joint pain.  Skin: Negative.   Neurological: Negative.   Endo/Heme/Allergies: Negative.   Psychiatric/Behavioral: Negative.     There were no vitals taken for this visit. Physical Exam  Constitutional: She appears well-developed.  HENT:  Head: Normocephalic.  Eyes: Pupils are equal, round, and reactive to light.  Neck: Normal range of motion.  Cardiovascular: Normal rate.   Respiratory: Effort normal.  Neurological: She is alert.  Skin: Skin is warm.  Psychiatric: She has a normal mood and affect.   examination the right knee demonstrates palpable pedal pulses intact extensor mechanism stable clot crucial ligaments medial joint line tenderness anterior pain retropatellar no groin pain internal extra rotation leg full range of motion trace effusion   Assessment/Plan Impression is right knee medial meniscal tear and anterior cyst plan assist decompression and meniscal debridement risk benefits discussed with the patient we will and to infection or vessel damage knee stiffness all questions answered plan weightbearing as tolerated following surgery with knee range of motion exercises.  Molly Vance SCOTT 10/27/2014, 11:55 AM

## 2014-10-27 NOTE — Anesthesia Procedure Notes (Addendum)
Procedure Name: LMA Insertion Date/Time: 10/27/2014 12:22 PM Performed by: Ned Grace Pre-anesthesia Checklist: Patient identified, Timeout performed, Emergency Drugs available, Suction available and Patient being monitored Patient Re-evaluated:Patient Re-evaluated prior to inductionOxygen Delivery Method: Circle system utilized Preoxygenation: Pre-oxygenation with 100% oxygen Intubation Type: IV induction Ventilation: Mask ventilation without difficulty LMA: LMA inserted LMA Size: 4.0 Number of attempts: 1 Placement Confirmation: positive ETCO2 and breath sounds checked- equal and bilateral Tube secured with: Tape Dental Injury: Teeth and Oropharynx as per pre-operative assessment    Anesthesia Regional Block:  Adductor canal block  Pre-Anesthetic Checklist: ,, timeout performed, Correct Patient, Correct Site, Correct Laterality, Correct Procedure, Correct Position, site marked, Risks and benefits discussed,  Surgical consent,  Pre-op evaluation,  At surgeon's request and post-op pain management  Laterality: Lower and Right  Prep: chloraprep       Needles:  Injection technique: Single-shot  Needle Type: Echogenic Stimulator Needle          Additional Needles:  Procedures: ultrasound guided (picture in chart) Adductor canal block Narrative:  Injection made incrementally with aspirations every 5 mL.  Performed by: Personally  Anesthesiologist: Deklan Minar, CHRIS  Additional Notes: H+P and labs reviewed, risks and benefits discussed with patient, procedure tolerated well without complications

## 2014-10-27 NOTE — Transfer of Care (Addendum)
Immediate Anesthesia Transfer of Care Note  Patient: Molly Vance  Procedure(s) Performed: Procedure(s): KNEE ARTHROSCOPY WITH PARTIAL MEDIAL MENISECTOMY, CYST DECOMPRESSION. (Right)  Patient Location: PACU  Anesthesia Type:General  Level of Consciousness: awake, alert , oriented and patient cooperative  Airway & Oxygen Therapy: Patient Spontanous Breathing and Patient connected to nasal cannula oxygen  Post-op Assessment: Report given to RN and Post -op Vital signs reviewed and stable  Post vital signs: Reviewed and stable  Complications: No apparent anesthesia complications

## 2014-10-28 NOTE — Anesthesia Postprocedure Evaluation (Signed)
  Anesthesia Post-op Note  Patient: Molly Vance  Procedure(s) Performed: Procedure(s): KNEE ARTHROSCOPY WITH PARTIAL MEDIAL MENISECTOMY, CYST DECOMPRESSION. (Right)  Patient Location: PACU  Anesthesia Type:General and Regional  Level of Consciousness: awake  Airway and Oxygen Therapy: Patient Spontanous Breathing  Post-op Pain: none  Post-op Assessment: Post-op Vital signs reviewed, Patient's Cardiovascular Status Stable, Respiratory Function Stable, Patent Airway, No signs of Nausea or vomiting and Pain level controlled  Post-op Vital Signs: Reviewed and stable  Last Vitals:  Filed Vitals:   10/27/14 1420  BP:   Pulse: 87  Temp: 36.1 C  Resp:     Complications: No apparent anesthesia complications

## 2014-10-28 NOTE — Op Note (Signed)
NAME:  Molly Vance, Molly Vance               ACCOUNT NO.:  [id number]  MEDICAL RECORD NO.:  [id number]  LOCATION:  [facility]                         FACILITY:  [facility]  PHYSICIAN:  Anderson Malta, M.D.    DATE OF BIRTH:  29-Nov-1960  DATE OF PROCEDURE:  10/27/2014 DATE OF DISCHARGE:  10/27/2014                              OPERATIVE REPORT   PREOPERATIVE DIAGNOSES:  Right knee meniscal tear and ganglion cyst.  POSTOPERATIVE DIAGNOSES:  Right knee meniscal tear and ganglion cyst.  PROCEDURE:  Right knee arthroscopy with partial medial meniscectomy, decompression of cyst anterior portion of the lateral meniscus.  SURGEON:  Anderson Malta, M.D.  ASSISTANT:  Laure Kidney, RNFA.  INDICATIONS:  [name] is a patient with right knee pain and locking presents for operative management after explanation of risks and benefits.  OPERATIVE FINDINGS: 1. Examination under anesthesia, range of motion 0-145 degrees of     flexion with stability varus valgus stress.  ACL and PCL intact.     No posterolateral rotatory instability is noted. 2. Diagnostic arthroscopy.     a.     Intact patellofemoral compartment.     b.     Intact lateral compartment, articular cartilage, and      meniscus.     c.     Intact ACL and PCL.     d.     Intact medial compartment, articular cartilage, but with      radial tear of the posterior horn medial meniscus involving about      30% anterior-posterior width of the meniscal rim.     e.     Cyst off the anterior horn lateral meniscus in its mid      portion consistent with dislocation of the MRI scan.  PROCEDURE IN DETAIL:  The patient was brought to the operating room where general anesthetic was induced.  Preoperative antibiotics administered.  Time-out was called.  Right leg prescrubbed with alcohol and Betadine and allowed to air dry.  Prepped with DuraPrep solution and draped in sterile manner including the foot.  Time-out called.  Leg elevated, exsanguinated with Esmarch  wrap.  Tourniquet was inflated. Total tourniquet time 28 minutes at 300 mmHg.  Anterior, inferior, lateral portals were established.  Anterior, inferior, medial portal were established under direct visualization.  Diagnostic arthroscopy was performed.  Patellofemoral compartment intact.  Lateral compartment intact.  ACL and PCL intact.  Medial compartment demonstrated intact articular cartilage with small radial tear.  The posterior horn medial meniscus debrided back to a stable rim with a combination of basket punch and shaver, all about 30% anterior and posterior width of the meniscus was involved.  Attention was then directed towards the anterolateral tibial plateau region.  After debriding the part of the fat pad, the cyst was visible.  It was decompressed and removed with the shaver.  It appeared to emanate off the undersurface of the meniscocapsular junction anteriorly.  Following cyst decompression, thorough irrigation was performed.  Tourniquet was released.  Bleeding points encountered and controlled with electrocautery.  Thorough irrigation of the knee joint was performed.  Portals were closed using 3-0 nylon suture.  Solution of Marcaine, morphine, and clonidine were  injected into the knee.  The patient tolerated the procedure well without immediate complications. Transferred to the recovery room in stable condition.  She will follow up with me in 7 days.     Anderson Malta, M.D.     GSD/MEDQ  D:  10/27/2014  T:  10/28/2014  Job:  [id number]

## 2014-10-29 ENCOUNTER — Encounter (HOSPITAL_COMMUNITY): Payer: Self-pay | Admitting: Orthopedic Surgery

## 2015-04-07 ENCOUNTER — Other Ambulatory Visit (HOSPITAL_COMMUNITY): Payer: Self-pay | Admitting: Obstetrics and Gynecology

## 2015-04-07 DIAGNOSIS — Z803 Family history of malignant neoplasm of breast: Secondary | ICD-10-CM

## 2015-04-20 ENCOUNTER — Ambulatory Visit (HOSPITAL_COMMUNITY)
Admission: RE | Admit: 2015-04-20 | Discharge: 2015-04-20 | Disposition: A | Discharge status: Home / Self Care | Payer: 59 | Source: Ambulatory Visit | Attending: Obstetrics and Gynecology | Admitting: Obstetrics and Gynecology

## 2015-04-20 DIAGNOSIS — Z803 Family history of malignant neoplasm of breast: Secondary | ICD-10-CM | POA: Diagnosis not present

## 2015-04-20 DIAGNOSIS — R922 Inconclusive mammogram: Secondary | ICD-10-CM | POA: Diagnosis not present

## 2015-04-20 MED ORDER — GADOBENATE DIMEGLUMINE 529 MG/ML IV SOLN
15.0000 mL | Freq: Once | INTRAVENOUS | Status: AC | PRN
  Administered 2015-04-20: 15 mL via INTRAVENOUS

## 2015-08-16 ENCOUNTER — Telehealth: Payer: 59 | Admitting: Nurse Practitioner

## 2015-08-16 DIAGNOSIS — J0100 Acute maxillary sinusitis, unspecified: Secondary | ICD-10-CM

## 2015-08-16 MED ORDER — AZITHROMYCIN 250 MG PO TABS: ORAL_TABLET | ORAL | Status: DC

## 2015-08-16 NOTE — Progress Notes (Signed)

## 2015-09-15 MED FILL — FLUTICASONE PROP 50 MCG SPR: 50 | 30 days supply | Qty: 16 | Fill #5

## 2015-09-15 MED FILL — ZOLPIDEM TARTRATE 5 MG TAB: 5 | 30 days supply | Qty: 30 | Fill #0

## 2015-09-15 MED FILL — OLOPATADINE HCL 0.1% EYE DR: 0.1 | 25 days supply | Qty: 5 | Fill #6

## 2015-11-04 MED FILL — ZOLPIDEM TARTRATE 5 MG TAB: 5 | 30 days supply | Qty: 30 | Fill #1

## 2015-11-04 MED FILL — FLUTICASONE PROP 50 MCG SPR: 50 | 30 days supply | Qty: 16 | Fill #6

## 2015-11-23 DIAGNOSIS — D225 Melanocytic nevi of trunk: Secondary | ICD-10-CM | POA: Diagnosis not present

## 2015-11-23 DIAGNOSIS — L7211 Pilar cyst: Secondary | ICD-10-CM | POA: Diagnosis not present

## 2015-11-23 DIAGNOSIS — D2272 Melanocytic nevi of left lower limb, including hip: Secondary | ICD-10-CM | POA: Diagnosis not present

## 2015-11-23 DIAGNOSIS — L814 Other melanin hyperpigmentation: Secondary | ICD-10-CM | POA: Diagnosis not present

## 2015-11-23 DIAGNOSIS — L821 Other seborrheic keratosis: Secondary | ICD-10-CM | POA: Diagnosis not present

## 2015-11-23 DIAGNOSIS — D485 Neoplasm of uncertain behavior of skin: Secondary | ICD-10-CM | POA: Diagnosis not present

## 2015-11-23 DIAGNOSIS — L570 Actinic keratosis: Secondary | ICD-10-CM | POA: Diagnosis not present

## 2015-11-23 DIAGNOSIS — D2271 Melanocytic nevi of right lower limb, including hip: Secondary | ICD-10-CM | POA: Diagnosis not present

## 2015-11-29 MED FILL — PREMARIN VAGINAL CREAM-APPL: 0.625 | 49 days supply | Qty: 30 | Fill #2

## 2015-12-20 MED FILL — ZOLPIDEM TARTRATE 5 MG TAB: 5 | 30 days supply | Qty: 30 | Fill #2

## 2015-12-20 MED FILL — FLUTICASONE PROP 50 MCG SPR: 50 | 30 days supply | Qty: 16 | Fill #0

## 2015-12-20 MED FILL — OLOPATADINE HCL 0.1% EYE DR: 0.1 | 25 days supply | Qty: 5 | Fill #0

## 2016-01-07 MED FILL — VALACYCLOVIR HCL 500 MG TAB: 500 | 10 days supply | Qty: 20 | Fill #1

## 2016-01-18 DIAGNOSIS — Z0000 Encounter for general adult medical examination without abnormal findings: Secondary | ICD-10-CM | POA: Diagnosis not present

## 2016-01-18 DIAGNOSIS — R739 Hyperglycemia, unspecified: Secondary | ICD-10-CM | POA: Diagnosis not present

## 2016-01-25 DIAGNOSIS — G4709 Other insomnia: Secondary | ICD-10-CM | POA: Diagnosis not present

## 2016-01-25 DIAGNOSIS — I868 Varicose veins of other specified sites: Secondary | ICD-10-CM | POA: Diagnosis not present

## 2016-01-25 DIAGNOSIS — Z0000 Encounter for general adult medical examination without abnormal findings: Secondary | ICD-10-CM | POA: Diagnosis not present

## 2016-01-25 DIAGNOSIS — D239 Other benign neoplasm of skin, unspecified: Secondary | ICD-10-CM | POA: Diagnosis not present

## 2016-01-25 DIAGNOSIS — M859 Disorder of bone density and structure, unspecified: Secondary | ICD-10-CM | POA: Diagnosis not present

## 2016-01-25 DIAGNOSIS — Z1389 Encounter for screening for other disorder: Secondary | ICD-10-CM | POA: Diagnosis not present

## 2016-01-25 DIAGNOSIS — N951 Menopausal and female climacteric states: Secondary | ICD-10-CM | POA: Diagnosis not present

## 2016-01-25 DIAGNOSIS — R739 Hyperglycemia, unspecified: Secondary | ICD-10-CM | POA: Diagnosis not present

## 2016-01-25 DIAGNOSIS — R002 Palpitations: Secondary | ICD-10-CM | POA: Diagnosis not present

## 2016-01-26 MED FILL — ZOLPIDEM TARTRATE 5 MG TAB: 5 | 30 days supply | Qty: 30 | Fill #3

## 2016-01-26 MED FILL — FLUTICASONE PROP 50 MCG SPR: 50 | 30 days supply | Qty: 16 | Fill #1

## 2016-02-03 DIAGNOSIS — Z1212 Encounter for screening for malignant neoplasm of rectum: Secondary | ICD-10-CM | POA: Diagnosis not present

## 2016-03-15 MED FILL — PREMARIN VAGINAL CREAM-APPL: 0.625 | 90 days supply | Qty: 30 | Fill #0

## 2016-03-15 MED FILL — OLOPATADINE HCL 0.1% EYE DR: 0.1 | 25 days supply | Qty: 5 | Fill #1

## 2016-03-15 MED FILL — FLUTICASONE PROP 50 MCG SPR: 50 | 30 days supply | Qty: 16 | Fill #2

## 2016-03-15 MED FILL — ZOLPIDEM TARTRATE 5 MG TAB: 5 | 30 days supply | Qty: 30 | Fill #0

## 2016-04-04 DIAGNOSIS — Z1231 Encounter for screening mammogram for malignant neoplasm of breast: Secondary | ICD-10-CM | POA: Diagnosis not present

## 2016-04-04 DIAGNOSIS — Z01419 Encounter for gynecological examination (general) (routine) without abnormal findings: Secondary | ICD-10-CM | POA: Diagnosis not present

## 2016-04-04 DIAGNOSIS — Z6824 Body mass index (BMI) 24.0-24.9, adult: Secondary | ICD-10-CM | POA: Diagnosis not present

## 2016-04-17 ENCOUNTER — Telehealth: Payer: 59 | Admitting: Nurse Practitioner

## 2016-04-17 DIAGNOSIS — J019 Acute sinusitis, unspecified: Secondary | ICD-10-CM

## 2016-04-17 MED ORDER — AZITHROMYCIN 250 MG PO TABS: ORAL_TABLET | ORAL | 0 refills | Status: AC

## 2016-04-17 MED ORDER — FLUTICASONE PROPIONATE 50 MCG/ACT NA SUSP: 2.0000 | Freq: Every day | NASAL | 0 refills | Status: DC

## 2016-04-17 MED FILL — AZITHROMYCIN 250 MG TABLET: 250 | 5 days supply | Qty: 6 | Fill #0

## 2016-04-17 MED FILL — FLUTICASONE PROP 50 MCG SPR: 50 | 30 days supply | Qty: 16 | Fill #0

## 2016-04-17 NOTE — Progress Notes (Signed)
We are sorry that you are not feeling well.  Here is how we plan to help!  Based on what you have shared with me it looks like you have sinusitis.  Sinusitis is inflammation and infection in the sinus cavities of the head.  Based on your presentation I believe you most likely have Acute Viral Sinusitis.This is an infection most likely caused by a virus. There is not specific treatment for viral sinusitis other than to help you with the symptoms until the infection runs its course.  You may use an oral decongestant such as Mucinex D or if you have glaucoma or high blood pressure use plain Mucinex. Saline nasal spray help and can safely be used as often as needed for congestion, I have prescribed: Fluticasone nasal spray two sprays in each nostril twice a day and Azithromycin (Zithromax- Z-PAK).  Some authorities believe that zinc sprays or the use of Echinacea may shorten the course of your symptoms.  Sinus infections are not as easily transmitted as other respiratory infection, however we still recommend that you avoid close contact with loved ones, especially the very young and elderly.  Remember to wash your hands thoroughly throughout the day as this is the number one way to prevent the spread of infection!  Home Care:  Only take medications as instructed by your medical team.  Complete the entire course of an antibiotic.  Do not take these medications with alcohol.  A steam or ultrasonic humidifier can help congestion.  You can place a towel over your head and breathe in the steam from hot water coming from a faucet.  Avoid close contacts especially the very young and the elderly.  Cover your mouth when you cough or sneeze.  Always remember to wash your hands.  Get Help Right Away If:  You develop worsening fever or sinus pain.  You develop a severe head ache or visual changes.  Your symptoms persist after you have completed your treatment plan.  Make sure you  Understand these  instructions.  Will watch your condition.  Will get help right away if you are not doing well or get worse.  Your e-visit answers were reviewed by a board certified advanced clinical practitioner to complete your personal care plan.  Depending on the condition, your plan could have included both over the counter or prescription medications.  If there is a problem please reply  once you have received a response from your provider.  Your safety is important to [place].  If you have drug allergies check your prescription carefully.    You can use MyChart to ask questions about today's visit, request a non-urgent call back, or ask for a work or school excuse for 24 hours related to this e-Visit. If it has been greater than 24 hours you will need to follow up with your provider, or enter a new e-Visit to address those concerns.  You will get an e-mail in the next two days asking about your experience.  I hope that your e-visit has been valuable and will speed your recovery. Thank you for using e-visits.

## 2016-05-05 MED FILL — ZOLPIDEM TARTRATE 5 MG TAB: 5 | 30 days supply | Qty: 30 | Fill #1

## 2016-05-08 MED FILL — OLOPATADINE HCL 0.1% EYE DR: 0.1 | 25 days supply | Qty: 5 | Fill #2

## 2016-06-01 MED FILL — FLUTICASONE PROP 50 MCG SPR: 50 | 30 days supply | Qty: 16 | Fill #3

## 2016-06-01 MED FILL — VALACYCLOVIR HCL 500 MG TAB: 500 | 10 days supply | Qty: 20 | Fill #0

## 2016-06-01 MED FILL — OLOPATADINE HCL 0.1% EYE DR: 0.1 | 25 days supply | Qty: 5 | Fill #3

## 2016-06-21 MED FILL — PREMARIN VAGINAL CREAM-APPL: 0.625 | 90 days supply | Qty: 30 | Fill #1

## 2016-06-21 MED FILL — ZOLPIDEM TARTRATE 5 MG TAB: 5 | 30 days supply | Qty: 30 | Fill #2

## 2016-07-25 MED FILL — ZOLPIDEM TARTRATE 5 MG TAB: 5 | 30 days supply | Qty: 30 | Fill #3

## 2016-07-25 MED FILL — OLOPATADINE HCL 0.1% EYE DR: 0.1 | 25 days supply | Qty: 5 | Fill #4

## 2016-07-26 MED FILL — FLUTICASONE PROP 50 MCG SPR: 50 | 30 days supply | Qty: 16 | Fill #4

## 2016-07-28 MED FILL — RESTASIS 0.05% EYE EMULSION: 0.05 | 90 days supply | Qty: 180 | Fill #0

## 2016-08-29 MED FILL — AZITHROMYCIN 250 MG TABLET: 250 | 5 days supply | Qty: 6 | Fill #0

## 2016-09-06 MED FILL — OLOPATADINE HCL 0.1% EYE DR: 0.1 | 25 days supply | Qty: 5 | Fill #5

## 2016-09-06 MED FILL — FLUTICASONE PROP 50 MCG SPR: 50 | 30 days supply | Qty: 16 | Fill #5

## 2016-09-06 MED FILL — ZOLPIDEM TARTRATE 5 MG TAB: 5 | 30 days supply | Qty: 30 | Fill #4

## 2016-09-07 DIAGNOSIS — L7211 Pilar cyst: Secondary | ICD-10-CM | POA: Diagnosis not present

## 2016-11-07 MED FILL — FLUTICASONE PROP 50 MCG SPR: 50 | 30 days supply | Qty: 16 | Fill #6

## 2016-11-07 MED FILL — OLOPATADINE HCL 0.1% EYE DR: 0.1 | 25 days supply | Qty: 5 | Fill #6

## 2016-11-07 MED FILL — PREMARIN VAGINAL CREAM-APPL: 0.625 | 90 days supply | Qty: 30 | Fill #2

## 2016-11-08 MED FILL — ZOLPIDEM TARTRATE 5 MG TAB: 5 | 30 days supply | Qty: 30 | Fill #0

## 2016-11-17 ENCOUNTER — Encounter: Payer: Self-pay | Admitting: *Deleted

## 2016-11-28 DIAGNOSIS — D2271 Melanocytic nevi of right lower limb, including hip: Secondary | ICD-10-CM | POA: Diagnosis not present

## 2016-11-28 DIAGNOSIS — L661 Lichen planopilaris: Secondary | ICD-10-CM | POA: Diagnosis not present

## 2016-11-28 DIAGNOSIS — D2272 Melanocytic nevi of left lower limb, including hip: Secondary | ICD-10-CM | POA: Diagnosis not present

## 2016-11-28 DIAGNOSIS — D224 Melanocytic nevi of scalp and neck: Secondary | ICD-10-CM | POA: Diagnosis not present

## 2016-11-28 DIAGNOSIS — D225 Melanocytic nevi of trunk: Secondary | ICD-10-CM | POA: Diagnosis not present

## 2016-11-28 DIAGNOSIS — L814 Other melanin hyperpigmentation: Secondary | ICD-10-CM | POA: Diagnosis not present

## 2016-11-28 DIAGNOSIS — L249 Irritant contact dermatitis, unspecified cause: Secondary | ICD-10-CM | POA: Diagnosis not present

## 2016-11-28 DIAGNOSIS — D2261 Melanocytic nevi of right upper limb, including shoulder: Secondary | ICD-10-CM | POA: Diagnosis not present

## 2016-11-28 MED FILL — FLUOCINONIDE 0.05% SOLUTION: 0.05 | 20 days supply | Qty: 60 | Fill #0

## 2016-11-29 ENCOUNTER — Ambulatory Visit (INDEPENDENT_AMBULATORY_CARE_PROVIDER_SITE_OTHER): Payer: Self-pay | Admitting: *Deleted

## 2016-11-29 DIAGNOSIS — I781 Nevus, non-neoplastic: Secondary | ICD-10-CM

## 2016-11-29 NOTE — Progress Notes (Signed)
X=.3% Sotradecol administered with a 27g butterfly.  Patient received a total of 6cc.  Treated all areas of concern. Easy access. Tol well. Anticipate good results for this nice lady. Follow prn.    Compression stockings applied: Yes.  and 4X4"s over right knee retic for increased compression.

## 2016-12-13 ENCOUNTER — Ambulatory Visit: Payer: 59 | Admitting: *Deleted

## 2016-12-28 MED FILL — ZOLPIDEM TARTRATE 5 MG TAB: 5 | 30 days supply | Qty: 30 | Fill #1

## 2016-12-28 MED FILL — FLUTICASONE PROP 50 MCG SPR: 50 | 30 days supply | Qty: 16 | Fill #0

## 2016-12-28 MED FILL — OLOPATADINE HCL 0.1 % SOLN: 0.1 | 25 days supply | Qty: 5 | Fill #0

## 2017-01-23 DIAGNOSIS — Z0000 Encounter for general adult medical examination without abnormal findings: Secondary | ICD-10-CM | POA: Diagnosis not present

## 2017-01-23 DIAGNOSIS — M859 Disorder of bone density and structure, unspecified: Secondary | ICD-10-CM | POA: Diagnosis not present

## 2017-01-23 DIAGNOSIS — R7309 Other abnormal glucose: Secondary | ICD-10-CM | POA: Diagnosis not present

## 2017-01-30 DIAGNOSIS — Z1212 Encounter for screening for malignant neoplasm of rectum: Secondary | ICD-10-CM | POA: Diagnosis not present

## 2017-01-30 DIAGNOSIS — I868 Varicose veins of other specified sites: Secondary | ICD-10-CM | POA: Diagnosis not present

## 2017-01-30 DIAGNOSIS — Z23 Encounter for immunization: Secondary | ICD-10-CM | POA: Diagnosis not present

## 2017-01-30 DIAGNOSIS — R002 Palpitations: Secondary | ICD-10-CM | POA: Diagnosis not present

## 2017-01-30 DIAGNOSIS — N951 Menopausal and female climacteric states: Secondary | ICD-10-CM | POA: Diagnosis not present

## 2017-01-30 DIAGNOSIS — R7309 Other abnormal glucose: Secondary | ICD-10-CM | POA: Diagnosis not present

## 2017-01-30 DIAGNOSIS — D239 Other benign neoplasm of skin, unspecified: Secondary | ICD-10-CM | POA: Diagnosis not present

## 2017-01-30 DIAGNOSIS — M23306 Other meniscus derangements, unspecified meniscus, right knee: Secondary | ICD-10-CM | POA: Diagnosis not present

## 2017-01-30 DIAGNOSIS — Z0000 Encounter for general adult medical examination without abnormal findings: Secondary | ICD-10-CM | POA: Diagnosis not present

## 2017-01-30 DIAGNOSIS — M859 Disorder of bone density and structure, unspecified: Secondary | ICD-10-CM | POA: Diagnosis not present

## 2017-01-30 DIAGNOSIS — G4709 Other insomnia: Secondary | ICD-10-CM | POA: Diagnosis not present

## 2017-01-30 DIAGNOSIS — Z1389 Encounter for screening for other disorder: Secondary | ICD-10-CM | POA: Diagnosis not present

## 2017-02-16 MED FILL — ZOLPIDEM TARTRATE 5 MG TAB: 5 | 30 days supply | Qty: 30 | Fill #2

## 2017-03-08 MED FILL — PREMARIN VAGINAL CREAM-APPL: 0.625 | 90 days supply | Qty: 30 | Fill #3

## 2017-03-15 MED FILL — FLUTICASONE PROP 50 MCG SPR: 50 | 30 days supply | Qty: 16 | Fill #1

## 2017-04-05 MED FILL — ZOLPIDEM TARTRATE 5 MG TAB: 5 | 30 days supply | Qty: 30 | Fill #3

## 2017-05-08 DIAGNOSIS — Z1231 Encounter for screening mammogram for malignant neoplasm of breast: Secondary | ICD-10-CM | POA: Diagnosis not present

## 2017-05-08 DIAGNOSIS — Z6825 Body mass index (BMI) 25.0-25.9, adult: Secondary | ICD-10-CM | POA: Diagnosis not present

## 2017-05-08 DIAGNOSIS — Z01419 Encounter for gynecological examination (general) (routine) without abnormal findings: Secondary | ICD-10-CM | POA: Diagnosis not present

## 2017-05-08 DIAGNOSIS — N760 Acute vaginitis: Secondary | ICD-10-CM | POA: Diagnosis not present

## 2017-05-08 MED FILL — TRIAMCINOLONE 0.1% CREAM: 0.1 | 7 days supply | Qty: 15 | Fill #0

## 2017-05-09 MED FILL — ZOLPIDEM TARTRATE 5 MG TAB: 5 | 30 days supply | Qty: 30 | Fill #0

## 2017-05-09 MED FILL — OLOPATADINE HCL 0.1 % SOLN: 0.1 | 25 days supply | Qty: 5 | Fill #1

## 2017-05-09 MED FILL — FLUTICASONE PROP 50 MCG SPR: 50 | 30 days supply | Qty: 16 | Fill #2

## 2017-05-29 DIAGNOSIS — H5212 Myopia, left eye: Secondary | ICD-10-CM | POA: Diagnosis not present

## 2017-06-19 DIAGNOSIS — Z13820 Encounter for screening for osteoporosis: Secondary | ICD-10-CM | POA: Diagnosis not present

## 2017-06-28 MED FILL — PREMARIN VAGINAL CREAM-APPL: 0.625 | 90 days supply | Qty: 30 | Fill #0

## 2017-06-28 MED FILL — ZOLPIDEM TARTRATE 5 MG TABL: 5 | 30 days supply | Qty: 30 | Fill #1

## 2017-06-28 MED FILL — OLOPATADINE HCL 0.1% EYE DR: 0.1 | 25 days supply | Qty: 5 | Fill #2

## 2017-06-28 MED FILL — FLUTICASONE PROP 50 MCG SPR: 50 | 30 days supply | Qty: 16 | Fill #3

## 2017-08-14 MED FILL — OLOPATADINE HCL 0.1% EYE DR: 0.1 | 25 days supply | Qty: 5 | Fill #3

## 2017-08-14 MED FILL — ZOLPIDEM TARTRATE 5 MG TABL: 5 | 30 days supply | Qty: 30 | Fill #2

## 2017-08-14 MED FILL — CEFDINIR 300 MG CAPSULE: 300 | 10 days supply | Qty: 20 | Fill #0

## 2017-08-14 MED FILL — FLUTICASONE PROP 50 MCG SPR: 50 | 30 days supply | Qty: 16 | Fill #4

## 2017-10-04 MED FILL — ZOLPIDEM TARTRATE 5 MG TABL: 5 | 30 days supply | Qty: 30 | Fill #3

## 2017-11-12 MED FILL — FLUTICASONE PROP 50 MCG SPR: 50 | 30 days supply | Qty: 16 | Fill #5

## 2017-11-12 MED FILL — OLOPATADINE HCL 0.1% EYE DR: 0.1 | 25 days supply | Qty: 5 | Fill #4

## 2017-11-14 MED FILL — ZOLPIDEM TARTRATE 5 MG TABL: 5 | 30 days supply | Qty: 30 | Fill #0

## 2017-12-04 DIAGNOSIS — D2261 Melanocytic nevi of right upper limb, including shoulder: Secondary | ICD-10-CM | POA: Diagnosis not present

## 2017-12-04 DIAGNOSIS — D485 Neoplasm of uncertain behavior of skin: Secondary | ICD-10-CM | POA: Diagnosis not present

## 2017-12-04 DIAGNOSIS — L814 Other melanin hyperpigmentation: Secondary | ICD-10-CM | POA: Diagnosis not present

## 2017-12-04 DIAGNOSIS — L7211 Pilar cyst: Secondary | ICD-10-CM | POA: Diagnosis not present

## 2017-12-04 DIAGNOSIS — D2272 Melanocytic nevi of left lower limb, including hip: Secondary | ICD-10-CM | POA: Diagnosis not present

## 2017-12-04 DIAGNOSIS — D225 Melanocytic nevi of trunk: Secondary | ICD-10-CM | POA: Diagnosis not present

## 2017-12-04 DIAGNOSIS — D224 Melanocytic nevi of scalp and neck: Secondary | ICD-10-CM | POA: Diagnosis not present

## 2017-12-04 DIAGNOSIS — L668 Other cicatricial alopecia: Secondary | ICD-10-CM | POA: Diagnosis not present

## 2017-12-04 DIAGNOSIS — D2271 Melanocytic nevi of right lower limb, including hip: Secondary | ICD-10-CM | POA: Diagnosis not present

## 2017-12-04 DIAGNOSIS — L821 Other seborrheic keratosis: Secondary | ICD-10-CM | POA: Diagnosis not present

## 2017-12-12 MED FILL — HYDROXYCHLOROQUINE 200 MG T: 200 | 30 days supply | Qty: 60 | Fill #0

## 2018-01-01 DIAGNOSIS — L668 Other cicatricial alopecia: Secondary | ICD-10-CM | POA: Diagnosis not present

## 2018-01-01 DIAGNOSIS — L661 Lichen planopilaris: Secondary | ICD-10-CM | POA: Diagnosis not present

## 2018-01-01 MED FILL — CLOBETASOL 0.05% SOLUTION: 0.05 | 50 days supply | Qty: 50 | Fill #0

## 2018-01-02 MED FILL — ZOLPIDEM TARTRATE 5 MG TAB: 5 | 30 days supply | Qty: 30 | Fill #1

## 2018-01-14 MED FILL — HYDROXYCHLOROQUINE 200 MG T: 200 | 30 days supply | Qty: 60 | Fill #1

## 2018-01-15 MED FILL — FLUTICASONE PROP 50 MCG SPR: 50 | 30 days supply | Qty: 16 | Fill #0

## 2018-01-15 MED FILL — OLOPATADINE HCL 0.1% EYE DR: 0.1 | 25 days supply | Qty: 5 | Fill #0

## 2018-02-08 ENCOUNTER — Telehealth: Payer: 59 | Admitting: Family

## 2018-02-08 DIAGNOSIS — H109 Unspecified conjunctivitis: Secondary | ICD-10-CM | POA: Diagnosis not present

## 2018-02-08 MED ORDER — OFLOXACIN 0.3 % OP SOLN: 2.0000 [drp] | Freq: Four times a day (QID) | OPHTHALMIC | 0 refills | Status: AC | PRN

## 2018-02-08 MED FILL — OFLOXACIN 0.3% EYE DROPS: 0.3 | 13 days supply | Qty: 10 | Fill #0

## 2018-02-08 NOTE — Progress Notes (Signed)
Thank you for the details you included in the comment boxes. Those details are very helpful in determining the best course of treatment for you and help [place] to provide the best care.  We are sorry that you are not feeling well.  Here is how we plan to help!  Based on what you have shared with me it looks like you have conjunctivitis.  Conjunctivitis is a common inflammatory or infectious condition of the eye that is often referred to as "pink eye".  In most cases it is contagious (viral or bacterial). However, not all conjunctivitis requires antibiotics (ex. Allergic).  We have made appropriate suggestions for you based upon your presentation.  I have prescribed Oflaxacin 1-2 drops 4 times a day times 5 days   Pink eye can be highly contagious.  It is typically spread through direct contact with secretions, or contaminated objects or surfaces that one may have touched.  Strict handwashing is suggested with soap and water is urged.  If not available, use alcohol based had sanitizer.  Avoid unnecessary touching of the eye.  If you wear contact lenses, you will need to refrain from wearing them until you see no white discharge from the eye for at least 24 hours after being on medication.  You should see symptom improvement in 1-2 days after starting the medication regimen.  Call us if symptoms are not improved in 1-2 days.  Home Care:  Wash your hands often!  Do not wear your contacts until you complete your treatment plan.  Avoid sharing towels, bed linen, personal items with a person who has pink eye.  See attention for anyone in your home with similar symptoms.  Get Help Right Away If:  Your symptoms do not improve.  You develop blurred or loss of vision.  Your symptoms worsen (increased discharge, pain or redness)  Your e-visit answers were reviewed by a board certified advanced clinical practitioner to complete your personal care plan.  Depending on the condition, your plan could have  included both over the counter or prescription medications.  If there is a problem please reply  once you have received a response from your provider.  Your safety is important to [place].  If you have drug allergies check your prescription carefully.    You can use MyChart to ask questions about today's visit, request a non-urgent call back, or ask for a work or school excuse for 24 hours related to this e-Visit. If it has been greater than 24 hours you will need to follow up with your provider, or enter a new e-Visit to address those concerns.   You will get an e-mail in the next two days asking about your experience.  I hope that your e-visit has been valuable and will speed your recovery. Thank you for using e-visits.

## 2018-02-12 DIAGNOSIS — L661 Lichen planopilaris: Secondary | ICD-10-CM | POA: Diagnosis not present

## 2018-02-12 MED FILL — HYDROXYCHLOROQUINE 200 MG T: 200 | 30 days supply | Qty: 60 | Fill #2

## 2018-02-12 MED FILL — OLOPATADINE HCL 0.1% EYE DR: 0.1 | 25 days supply | Qty: 5 | Fill #1

## 2018-02-12 MED FILL — ZOLPIDEM TARTRATE 5 MG TAB: 5 | 30 days supply | Qty: 30 | Fill #2

## 2018-02-13 MED FILL — RESTASIS 0.05% EYE EMULSION: 0.05 | 90 days supply | Qty: 180 | Fill #0

## 2018-03-26 MED FILL — HYDROXYCHLOROQUINE 200 MG T: 200 | 30 days supply | Qty: 60 | Fill #3

## 2018-03-26 MED FILL — ZOLPIDEM TARTRATE 5 MG TAB: 5 | 30 days supply | Qty: 30 | Fill #3

## 2018-03-26 MED FILL — FLUTICASONE PROP 50 MCG SPR: 50 | 30 days supply | Qty: 16 | Fill #1

## 2018-04-30 MED FILL — HYDROXYCHLOROQUINE SULFATE: 200 | 30 days supply | Qty: 60 | Fill #4

## 2018-04-30 MED FILL — OLOPATADINE HCL 0.1% EYE DR: 0.1 | 25 days supply | Qty: 5 | Fill #2

## 2018-05-20 DIAGNOSIS — Z1231 Encounter for screening mammogram for malignant neoplasm of breast: Secondary | ICD-10-CM | POA: Diagnosis not present

## 2018-05-20 DIAGNOSIS — Z6825 Body mass index (BMI) 25.0-25.9, adult: Secondary | ICD-10-CM | POA: Diagnosis not present

## 2018-05-20 DIAGNOSIS — Z01419 Encounter for gynecological examination (general) (routine) without abnormal findings: Secondary | ICD-10-CM | POA: Diagnosis not present

## 2018-05-28 ENCOUNTER — Other Ambulatory Visit (HOSPITAL_COMMUNITY): Payer: Self-pay | Admitting: Obstetrics and Gynecology

## 2018-05-28 DIAGNOSIS — Z803 Family history of malignant neoplasm of breast: Secondary | ICD-10-CM

## 2018-05-30 MED FILL — ZOLPIDEM TARTRATE 5 MG TAB: 5 | 30 days supply | Qty: 30 | Fill #0

## 2018-05-30 MED FILL — HYDROXYCHLOROQUINE SULFATE: 200 | 30 days supply | Qty: 60 | Fill #5

## 2018-06-04 ENCOUNTER — Ambulatory Visit (HOSPITAL_COMMUNITY)
Admission: RE | Admit: 2018-06-04 | Discharge: 2018-06-04 | Disposition: A | Discharge status: Home / Self Care | Payer: 59 | Source: Ambulatory Visit | Attending: Obstetrics and Gynecology | Admitting: Obstetrics and Gynecology

## 2018-06-04 DIAGNOSIS — Z1239 Encounter for other screening for malignant neoplasm of breast: Secondary | ICD-10-CM | POA: Diagnosis present

## 2018-06-04 DIAGNOSIS — N6489 Other specified disorders of breast: Secondary | ICD-10-CM | POA: Diagnosis not present

## 2018-06-04 DIAGNOSIS — H5212 Myopia, left eye: Secondary | ICD-10-CM | POA: Diagnosis not present

## 2018-06-04 DIAGNOSIS — Z803 Family history of malignant neoplasm of breast: Secondary | ICD-10-CM | POA: Insufficient documentation

## 2018-06-04 MED ORDER — GADOBUTROL 1 MMOL/ML IV SOLN
7.0000 mL | Freq: Once | INTRAVENOUS | Status: AC | PRN
  Administered 2018-06-04: 7 mL via INTRAVENOUS

## 2018-06-18 DIAGNOSIS — Z0000 Encounter for general adult medical examination without abnormal findings: Secondary | ICD-10-CM | POA: Diagnosis not present

## 2018-06-18 DIAGNOSIS — M859 Disorder of bone density and structure, unspecified: Secondary | ICD-10-CM | POA: Diagnosis not present

## 2018-06-18 DIAGNOSIS — R82998 Other abnormal findings in urine: Secondary | ICD-10-CM | POA: Diagnosis not present

## 2018-06-18 DIAGNOSIS — R7309 Other abnormal glucose: Secondary | ICD-10-CM | POA: Diagnosis not present

## 2018-06-25 DIAGNOSIS — F418 Other specified anxiety disorders: Secondary | ICD-10-CM | POA: Diagnosis not present

## 2018-06-25 DIAGNOSIS — I868 Varicose veins of other specified sites: Secondary | ICD-10-CM | POA: Diagnosis not present

## 2018-06-25 DIAGNOSIS — D239 Other benign neoplasm of skin, unspecified: Secondary | ICD-10-CM | POA: Diagnosis not present

## 2018-06-25 DIAGNOSIS — M859 Disorder of bone density and structure, unspecified: Secondary | ICD-10-CM | POA: Diagnosis not present

## 2018-06-25 DIAGNOSIS — N951 Menopausal and female climacteric states: Secondary | ICD-10-CM | POA: Diagnosis not present

## 2018-06-25 DIAGNOSIS — Z1389 Encounter for screening for other disorder: Secondary | ICD-10-CM | POA: Diagnosis not present

## 2018-06-25 DIAGNOSIS — R002 Palpitations: Secondary | ICD-10-CM | POA: Diagnosis not present

## 2018-06-25 DIAGNOSIS — Z0000 Encounter for general adult medical examination without abnormal findings: Secondary | ICD-10-CM | POA: Diagnosis not present

## 2018-06-25 DIAGNOSIS — R7309 Other abnormal glucose: Secondary | ICD-10-CM | POA: Diagnosis not present

## 2018-06-25 DIAGNOSIS — G4709 Other insomnia: Secondary | ICD-10-CM | POA: Diagnosis not present

## 2018-06-27 DIAGNOSIS — Z1212 Encounter for screening for malignant neoplasm of rectum: Secondary | ICD-10-CM | POA: Diagnosis not present

## 2018-06-27 MED FILL — PREMARIN VAGINAL CREAM-APPL: 0.625 | 53 days supply | Qty: 30 | Fill #0

## 2018-06-27 MED FILL — OLOPATADINE HCL 0.1% EYE DR: 0.1 | 25 days supply | Qty: 5 | Fill #3

## 2018-06-27 MED FILL — FLUTICASONE PROP 50 MCG SPR: 50 | 30 days supply | Qty: 16 | Fill #2

## 2018-07-11 MED FILL — HYDROXYCHLOROQUINE SULFATE: 200 | 30 days supply | Qty: 60 | Fill #0

## 2018-07-11 MED FILL — ZOLPIDEM TARTRATE 5 MG TAB: 5 | 30 days supply | Qty: 30 | Fill #0

## 2018-07-16 DIAGNOSIS — L438 Other lichen planus: Secondary | ICD-10-CM | POA: Diagnosis not present

## 2018-07-29 ENCOUNTER — Encounter (INDEPENDENT_AMBULATORY_CARE_PROVIDER_SITE_OTHER): Payer: Self-pay | Admitting: Orthopedic Surgery

## 2018-07-29 ENCOUNTER — Ambulatory Visit (INDEPENDENT_AMBULATORY_CARE_PROVIDER_SITE_OTHER): Payer: 59 | Admitting: Orthopedic Surgery

## 2018-07-29 ENCOUNTER — Ambulatory Visit (INDEPENDENT_AMBULATORY_CARE_PROVIDER_SITE_OTHER): Payer: 59

## 2018-07-29 DIAGNOSIS — M25562 Pain in left knee: Secondary | ICD-10-CM

## 2018-07-29 DIAGNOSIS — S838X2A Sprain of other specified parts of left knee, initial encounter: Secondary | ICD-10-CM

## 2018-07-31 ENCOUNTER — Encounter (INDEPENDENT_AMBULATORY_CARE_PROVIDER_SITE_OTHER): Payer: Self-pay | Admitting: Orthopedic Surgery

## 2018-07-31 NOTE — Progress Notes (Signed)
Office Visit Note   Patient: Molly Vance           Date of Birth: 05-25-1961           MRN: [id number] Visit Date: 07/29/2018 Requested by: Shon Baton, [address] [place], [place] [address] PCP: Shon Baton, MD  Subjective: Chief Complaint  Patient presents with  . Left Knee - Pain    HPI: Molly Vance is a patient with left knee pain.  She had right knee arthroscopy in February 2016.  She did well with that.  She had an injury to the left knee and she is having some medial joint line tenderness now.  Patient reports weakness and mechanical symptoms.  She did feel a medial pop 6 weeks ago while exercising.  The stabbing type pain.  She says it feels like the right knee did prior to surgery.              ROS: All systems reviewed are negative as they relate to the chief complaint within the history of present illness.  Patient denies  fevers or chills.   Assessment & Plan: Visit Diagnoses:  1. Left knee pain, unspecified chronicity   2. Injury of meniscus of left knee, initial encounter     Plan: Impression is left knee medial meniscal tear similar to her right knee.  She has an effusion and medial joint line tenderness today.  Radiographs are normal.  She is been taking ibuprofen and has failed conservative course of management.  Plan is MRI scan left knee to evaluate for medial meniscal tear.  I will see what that shows and follow-up with me after that study.  Follow-Up Instructions: Return for after MRI.   Orders:  Orders Placed This Encounter  Procedures  . XR KNEE 3 VIEW LEFT  . MR Knee Left w/o contrast   No orders of the defined types were placed in this encounter.     Procedures: No procedures performed   Clinical Data: No additional findings.  Objective: Vital Signs: There were no vitals taken for this visit.  Physical Exam:   Constitutional: Patient appears well-developed HEENT:  Head: Normocephalic Eyes:EOM are normal Neck: Normal range of  motion Cardiovascular: Normal rate Pulmonary/chest: Effort normal Neurologic: Patient is alert Skin: Skin is warm Psychiatric: Patient has normal mood and affect    Ortho Exam: Ortho exam demonstrates full active and passive range of motion of that left knee with an effusion positive joint line tenderness on the medial side stable collateral cruciate ligaments.  Pedal pulses palpable.  Right knee has full range of motion with no effusion.  Patient has normal gait.  Extensor mechanism is intact.  Specialty Comments:  No specialty comments available.  Imaging: No results found.   PMFS History: Patient Active Problem List   Diagnosis Date Noted  . Menopausal hot flushes 07/12/2011  . Menopause   . Allergy   . Hyperlipidemia    Past Medical History:  Diagnosis Date  . Allergy   . Headache   . Hyperlipidemia   . Insomnia   . Menopause   . Wears glasses     Family History  Problem Relation Age of Onset  . Diabetes Mother   . Hypertension Mother   . Diabetes Sister   . Diabetes Maternal Grandmother   . Breast cancer Sister   . Ulcerative colitis Sister   . Crohn's disease Sister   . Colon cancer Neg Hx     Past Surgical History:  Procedure Laterality Date  . BUNIONECTOMY  1984  . CESAREAN SECTION  09/23/88  . COLONOSCOPY    . KNEE ARTHROSCOPY WITH MEDIAL MENISECTOMY Right 10/27/2014   Procedure: KNEE ARTHROSCOPY WITH PARTIAL MEDIAL MENISECTOMY, CYST DECOMPRESSION.;  Surgeon: Meredith Pel, MD;  Location: [facility];  Service: Orthopedics;  Laterality: Right;  . TONSILLECTOMY  1983   Social History   Occupational History  . Not on file  Tobacco Use  . Smoking status: Never Smoker  . Smokeless tobacco: Never Used  Substance and Sexual Activity  . Alcohol use: Yes    Alcohol/week: 6.0 standard drinks    Types: 6 Glasses of wine per week    Comment: occasional  . Drug use: No  . Sexual activity: Yes

## 2018-08-15 ENCOUNTER — Ambulatory Visit (HOSPITAL_COMMUNITY)
Admission: RE | Admit: 2018-08-15 | Discharge: 2018-08-15 | Disposition: A | Discharge status: Home / Self Care | Payer: 59 | Source: Ambulatory Visit | Attending: Orthopedic Surgery | Admitting: Orthopedic Surgery

## 2018-08-15 DIAGNOSIS — M25562 Pain in left knee: Secondary | ICD-10-CM

## 2018-08-15 DIAGNOSIS — M7122 Synovial cyst of popliteal space [Baker], left knee: Secondary | ICD-10-CM | POA: Diagnosis not present

## 2018-08-15 DIAGNOSIS — M1712 Unilateral primary osteoarthritis, left knee: Secondary | ICD-10-CM | POA: Insufficient documentation

## 2018-08-19 ENCOUNTER — Encounter (INDEPENDENT_AMBULATORY_CARE_PROVIDER_SITE_OTHER): Payer: Self-pay | Admitting: Orthopedic Surgery

## 2018-08-19 ENCOUNTER — Ambulatory Visit (INDEPENDENT_AMBULATORY_CARE_PROVIDER_SITE_OTHER): Payer: 59 | Admitting: Orthopedic Surgery

## 2018-08-19 VITALS — Ht 65.0 in | Wt 150.0 lb

## 2018-08-19 DIAGNOSIS — M25562 Pain in left knee: Secondary | ICD-10-CM | POA: Diagnosis not present

## 2018-08-19 MED FILL — ZOLPIDEM TARTRATE 5 MG TAB: 5 | 30 days supply | Qty: 30 | Fill #1

## 2018-08-19 MED FILL — HYDROXYCHLOROQUINE SULFATE: 200 | 30 days supply | Qty: 60 | Fill #1

## 2018-08-20 MED FILL — OLOPATADINE HCL 0.1% EYE DR: 0.1 | 25 days supply | Qty: 5 | Fill #4

## 2018-08-20 MED FILL — CLOBETASOL 0.05% SOLUTION: 0.05 | 50 days supply | Qty: 50 | Fill #1

## 2018-08-20 MED FILL — PREMARIN VAGINAL CREAM-APPL: 0.625 | 53 days supply | Qty: 30 | Fill #1

## 2018-08-21 ENCOUNTER — Encounter (INDEPENDENT_AMBULATORY_CARE_PROVIDER_SITE_OTHER): Payer: Self-pay | Admitting: Orthopedic Surgery

## 2018-08-21 NOTE — Progress Notes (Signed)
Office Visit Note   Patient: Molly Vance           Date of Birth: [birth year]           MRN: [id number] Visit Date: 08/19/2018 Requested by: Shon Baton, [place],  [id number] PCP: Shon Baton, MD  Subjective: Chief Complaint  Patient presents with  . Left Knee - Pain, Follow-up    MRI Left Knee Review    HPI: [name] is a patient with left knee pain.  Here for MRI review.  States that the left knee is little better.  MRI scan was essentially normal.  Does still have medial sided pain at times.  Denies any mechanical symptoms.              ROS: All systems reviewed are negative as they relate to the chief complaint within the history of present illness.  Patient denies  fevers or chills.   Assessment & Plan: Visit Diagnoses:  1. Left knee pain, unspecified chronicity     Plan: Impression is well-functioning left knee with potential need for injection in the future if symptoms recur.  No clear operative indication at this time.  Continue with non-loadbearing quad strengthening exercises and I will see her back as needed.  Follow-Up Instructions: Return if symptoms worsen or fail to improve.   Orders:  No orders of the defined types were placed in this encounter.  No orders of the defined types were placed in this encounter.     Procedures: No procedures performed   Clinical Data: No additional findings.  Objective: Vital Signs: Ht 5\' 5"  (1.651 m)   Wt 150 lb (68 kg)   BMI 24.96 kg/m   Physical Exam:   Constitutional: Patient appears well-developed HEENT:  Head: Normocephalic Eyes:EOM are normal Neck: Normal range of motion Cardiovascular: Normal rate Pulmonary/chest: Effort normal Neurologic: Patient is alert Skin: Skin is warm Psychiatric: Patient has normal mood and affect    Ortho Exam: Ortho exam demonstrates normal gait alignment with no effusion on the left knee.  Collateral and cruciate ligaments stable.  Range of motion  full.  Mild medial joint line tenderness is present.  Specialty Comments:  No specialty comments available.  Imaging: No results found.   PMFS History: Patient Active Problem List   Diagnosis Date Noted  . Menopausal hot flushes 07/12/2011  . Menopause   . Allergy   . Hyperlipidemia    Past Medical History:  Diagnosis Date  . Allergy   . Headache   . Hyperlipidemia   . Insomnia   . Menopause   . Wears glasses     Family History  Problem Relation Age of Onset  . Diabetes Mother   . Hypertension Mother   . Diabetes Sister   . Diabetes Maternal Grandmother   . Breast cancer Sister   . Ulcerative colitis Sister   . Crohn's disease Sister   . Colon cancer Neg Hx     Past Surgical History:  Procedure Laterality Date  . BUNIONECTOMY  1984  . CESAREAN SECTION  09/23/88  . COLONOSCOPY    . KNEE ARTHROSCOPY WITH MEDIAL MENISECTOMY Right 10/27/2014   Procedure: KNEE ARTHROSCOPY WITH PARTIAL MEDIAL MENISECTOMY, CYST DECOMPRESSION.;  Surgeon: Meredith Pel, MD;  Location: [facility];  Service: Orthopedics;  Laterality: Right;  . TONSILLECTOMY  1983   Social History   Occupational History  . Not on file  Tobacco Use  . Smoking status: Never Smoker  . Smokeless  tobacco: Never Used  Substance and Sexual Activity  . Alcohol use: Yes    Alcohol/week: 6.0 standard drinks    Types: 6 Glasses of wine per week    Comment: occasional  . Drug use: No  . Sexual activity: Yes

## 2018-09-24 MED FILL — DOXYCYCLINE HYCLATE 100 MG: 100 | 7 days supply | Qty: 14 | Fill #0

## 2018-10-18 MED FILL — HYDROXYCHLOROQUINE SULFATE: 200 | 30 days supply | Qty: 60 | Fill #2

## 2018-10-21 MED FILL — ZOLPIDEM TARTRATE 5 MG TAB: 5 | 30 days supply | Qty: 30 | Fill #0

## 2018-11-22 MED FILL — ZOLPIDEM TARTRATE 5 MG TAB: 5 | 30 days supply | Qty: 30 | Fill #1

## 2018-11-22 MED FILL — OLOPATADINE HCL 0.1 % SOLN: 0.1 | 25 days supply | Qty: 5 | Fill #5

## 2018-11-26 MED FILL — HYDROXYCHLOROQUINE SULFATE: 200 | 90 days supply | Qty: 180 | Fill #0

## 2018-12-17 DIAGNOSIS — D225 Melanocytic nevi of trunk: Secondary | ICD-10-CM | POA: Diagnosis not present

## 2018-12-17 DIAGNOSIS — L814 Other melanin hyperpigmentation: Secondary | ICD-10-CM | POA: Diagnosis not present

## 2018-12-17 DIAGNOSIS — L661 Lichen planopilaris: Secondary | ICD-10-CM | POA: Diagnosis not present

## 2018-12-17 DIAGNOSIS — L821 Other seborrheic keratosis: Secondary | ICD-10-CM | POA: Diagnosis not present

## 2018-12-17 DIAGNOSIS — L433 Subacute (active) lichen planus: Secondary | ICD-10-CM | POA: Diagnosis not present

## 2018-12-17 DIAGNOSIS — D2271 Melanocytic nevi of right lower limb, including hip: Secondary | ICD-10-CM | POA: Diagnosis not present

## 2018-12-17 DIAGNOSIS — L720 Epidermal cyst: Secondary | ICD-10-CM | POA: Diagnosis not present

## 2018-12-17 DIAGNOSIS — L7211 Pilar cyst: Secondary | ICD-10-CM | POA: Diagnosis not present

## 2018-12-17 DIAGNOSIS — L570 Actinic keratosis: Secondary | ICD-10-CM | POA: Diagnosis not present

## 2019-01-07 MED FILL — OLOPATADINE HCL 0.1 % SOLN: 0.1 | 25 days supply | Qty: 5 | Fill #6

## 2019-01-07 MED FILL — ZOLPIDEM TARTRATE 5 MG TAB: 5 | 30 days supply | Qty: 30 | Fill #0

## 2019-01-15 ENCOUNTER — Encounter: Payer: Self-pay | Admitting: Orthopedic Surgery

## 2019-01-16 MED ORDER — MELOXICAM 15 MG PO TABS: 15.0000 mg | ORAL_TABLET | Freq: Every day | ORAL | 2 refills | Status: DC

## 2019-01-16 MED FILL — MELOXICAM 15 MG TABLET: 15 | 30 days supply | Qty: 30 | Fill #0

## 2019-01-16 NOTE — Telephone Encounter (Signed)
Ok for [employer] 15 q d prnb # 30 2 rf

## 2019-01-17 MED FILL — FLUOCINONIDE 0.05% CREAM: 0.05 | 30 days supply | Qty: 30 | Fill #0

## 2019-02-12 MED FILL — ZOLPIDEM TARTRATE 5 MG TAB: 5 | 30 days supply | Qty: 30 | Fill #1

## 2019-03-06 MED FILL — HYDROXYCHLOROQUINE SULFATE: 200 | 90 days supply | Qty: 180 | Fill #1

## 2019-03-06 MED FILL — MELOXICAM 15 MG TABLET: 15 | 30 days supply | Qty: 30 | Fill #1

## 2019-03-31 MED FILL — ZOLPIDEM TARTRATE 5 MG TAB: 5 | 30 days supply | Qty: 30 | Fill #0

## 2019-03-31 MED FILL — OLOPATADINE HCL 0.1 % SOLN: 0.1 | 25 days supply | Qty: 5 | Fill #0

## 2019-04-15 DIAGNOSIS — Z79899 Other long term (current) drug therapy: Secondary | ICD-10-CM | POA: Diagnosis not present

## 2019-04-15 DIAGNOSIS — H5212 Myopia, left eye: Secondary | ICD-10-CM | POA: Diagnosis not present

## 2019-04-21 ENCOUNTER — Ambulatory Visit (INDEPENDENT_AMBULATORY_CARE_PROVIDER_SITE_OTHER): Payer: 59 | Admitting: Orthopedic Surgery

## 2019-04-21 ENCOUNTER — Encounter: Payer: Self-pay | Admitting: Orthopedic Surgery

## 2019-04-21 DIAGNOSIS — M25462 Effusion, left knee: Secondary | ICD-10-CM | POA: Diagnosis not present

## 2019-04-21 NOTE — Progress Notes (Signed)
Office Visit Note   Patient: Molly Vance           Date of Birth: [birth year]           MRN: [id number] Visit Date: 04/21/2019 Requested by: Shon Baton, [place],  [place] [address] PCP: Shon Baton, MD  Subjective: Chief Complaint  Patient presents with  . Left Knee - Pain    HPI: Molly SHIPPEE is a 58 y.o. female who presents to the office complaining of left knee pain.  Patient has a history of left knee pain for which she was last seen in December 2019.  At this visit an MRI of the left knee was reviewed and found to be essentially normal.  Since spring 2020, patient notes worsening pain.  She localizes her pain to the medial joint line.  She notes very occasional mechanical symptoms, with locking up of her knee.  Her left knee pain now wakes her up at night.  She denies any injury.  She denies any instability.  Patient denies any groin pain or low back pain.              ROS:  All systems reviewed are negative as they relate to the chief complaint within the history of present illness.  Patient denies fevers or chills.  Assessment & Plan: Visit Diagnoses: No diagnosis found.  Plan: Patient is a 58 year old female who presents with left knee pain.  She was seen last in December 2019 with a normal MRI.  A left knee injection was discussed at that appointment but patient was not in severe need, she was told to return if her pain worsened and we would consider a left knee injection in the future.  Today she reports multiple months of worsening pain.  Offered left knee injection, which patient accepted.  However, I do not have a great reason for her pain judging by the essentially normal MRI results.  If patient's pain does not improve significantly with this left knee injection, will order MRI arthrogram of the left knee.  Patient agrees with this plan and will follow-up with the office as needed.  Follow-Up Instructions: No follow-ups on file.   Orders:  No  orders of the defined types were placed in this encounter.  No orders of the defined types were placed in this encounter.     Procedures: Large Joint Inj: L knee on 04/25/2019 4:01 PM Indications: diagnostic evaluation, joint swelling and pain Details: 18 G 1.5 in needle, superolateral approach  Arthrogram: No  Medications: 5 mL lidocaine 1 %; 40 mg methylPREDNISolone acetate 40 MG/ML; 4 mL bupivacaine 0.25 % Outcome: tolerated well, no immediate complications Procedure, treatment alternatives, risks and benefits explained, specific risks discussed. Consent was given by the patient. Immediately prior to procedure a time out was called to verify the correct patient, procedure, equipment, support staff and site/side marked as required. Patient was prepped and draped in the usual sterile fashion.       Clinical Data: No additional findings.  Objective: Vital Signs: There were no vitals taken for this visit.  Physical Exam:  Constitutional: Patient appears well-developed HEENT:  Head: Normocephalic Eyes:EOM are normal Neck: Normal range of motion Cardiovascular: Normal rate Pulmonary/chest: Effort normal Neurologic: Patient is alert Skin: Skin is warm Psychiatric: Patient has normal mood and affect  Ortho Exam:  Left knee Exam Mild effusion Extensor mechanism intact No TTP over the lateral jointlines, quad tendon, patellar tendon, pes anserinus, patella, tibial  tubercle, LCL/MCL insertions TTP over the medial joint line Stable to varus/valgus stresses.  Stable to anterior/posterior drawer Extension to 0 degrees Flexion > 90 degrees  Specialty Comments:  No specialty comments available.  Imaging: No results found.   PMFS History: Patient Active Problem List   Diagnosis Date Noted  . Menopausal hot flushes 07/12/2011  . Menopause   . Allergy   . Hyperlipidemia    Past Medical History:  Diagnosis Date  . Allergy   . Headache   . Hyperlipidemia   . Insomnia    . Menopause   . Wears glasses     Family History  Problem Relation Age of Onset  . Diabetes Mother   . Hypertension Mother   . Diabetes Sister   . Diabetes Maternal Grandmother   . Breast cancer Sister   . Ulcerative colitis Sister   . Crohn's disease Sister   . Colon cancer Neg Hx     Past Surgical History:  Procedure Laterality Date  . BUNIONECTOMY  1984  . CESAREAN SECTION  09/23/88  . COLONOSCOPY    . KNEE ARTHROSCOPY WITH MEDIAL MENISECTOMY Right 10/27/2014   Procedure: KNEE ARTHROSCOPY WITH PARTIAL MEDIAL MENISECTOMY, CYST DECOMPRESSION.;  Surgeon: Meredith Pel, MD;  Location: [facility];  Service: Orthopedics;  Laterality: Right;  . TONSILLECTOMY  1983   Social History   Occupational History  . Not on file  Tobacco Use  . Smoking status: Never Smoker  . Smokeless tobacco: Never Used  Substance and Sexual Activity  . Alcohol use: Yes    Alcohol/week: 6.0 standard drinks    Types: 6 Glasses of wine per week    Comment: occasional  . Drug use: No  . Sexual activity: Yes

## 2019-04-25 ENCOUNTER — Encounter: Payer: Self-pay | Admitting: Orthopedic Surgery

## 2019-04-25 DIAGNOSIS — M25462 Effusion, left knee: Secondary | ICD-10-CM | POA: Diagnosis not present

## 2019-04-25 MED ORDER — LIDOCAINE HCL 1 % IJ SOLN
5.0000 mL | INTRAMUSCULAR | Status: AC | PRN
  Administered 2019-04-25: 5 mL

## 2019-04-25 MED ORDER — METHYLPREDNISOLONE ACETATE 40 MG/ML IJ SUSP
40.0000 mg | INTRAMUSCULAR | Status: AC | PRN
  Administered 2019-04-25: 40 mg via INTRA_ARTICULAR

## 2019-04-25 MED ORDER — BUPIVACAINE HCL 0.25 % IJ SOLN
4.0000 mL | INTRAMUSCULAR | Status: AC | PRN
  Administered 2019-04-25: 4 mL via INTRA_ARTICULAR

## 2019-05-12 MED FILL — ZOLPIDEM TARTRATE 5 MG TAB: 5 | 30 days supply | Qty: 30 | Fill #1

## 2019-06-03 DIAGNOSIS — Z01419 Encounter for gynecological examination (general) (routine) without abnormal findings: Secondary | ICD-10-CM | POA: Diagnosis not present

## 2019-06-03 DIAGNOSIS — Z803 Family history of malignant neoplasm of breast: Secondary | ICD-10-CM | POA: Diagnosis not present

## 2019-06-03 DIAGNOSIS — Z1231 Encounter for screening mammogram for malignant neoplasm of breast: Secondary | ICD-10-CM | POA: Diagnosis not present

## 2019-06-03 DIAGNOSIS — Z6825 Body mass index (BMI) 25.0-25.9, adult: Secondary | ICD-10-CM | POA: Diagnosis not present

## 2019-06-03 DIAGNOSIS — Z808 Family history of malignant neoplasm of other organs or systems: Secondary | ICD-10-CM | POA: Diagnosis not present

## 2019-06-03 MED FILL — clonazePAM 0.5 MG TABS: 0.5 | 30 days supply | Qty: 30 | Fill #0

## 2019-06-20 MED FILL — ZOLPIDEM TARTRATE 5 MG TAB: 5 | 30 days supply | Qty: 30 | Fill #0

## 2019-06-24 DIAGNOSIS — M859 Disorder of bone density and structure, unspecified: Secondary | ICD-10-CM | POA: Diagnosis not present

## 2019-06-24 DIAGNOSIS — Z0000 Encounter for general adult medical examination without abnormal findings: Secondary | ICD-10-CM | POA: Diagnosis not present

## 2019-06-24 DIAGNOSIS — R739 Hyperglycemia, unspecified: Secondary | ICD-10-CM | POA: Diagnosis not present

## 2019-07-01 DIAGNOSIS — F418 Other specified anxiety disorders: Secondary | ICD-10-CM | POA: Diagnosis not present

## 2019-07-01 DIAGNOSIS — Z1331 Encounter for screening for depression: Secondary | ICD-10-CM | POA: Diagnosis not present

## 2019-07-01 DIAGNOSIS — M25562 Pain in left knee: Secondary | ICD-10-CM | POA: Diagnosis not present

## 2019-07-01 DIAGNOSIS — M8580 Other specified disorders of bone density and structure, unspecified site: Secondary | ICD-10-CM | POA: Diagnosis not present

## 2019-07-01 DIAGNOSIS — Z0000 Encounter for general adult medical examination without abnormal findings: Secondary | ICD-10-CM | POA: Diagnosis not present

## 2019-07-01 DIAGNOSIS — L439 Lichen planus, unspecified: Secondary | ICD-10-CM | POA: Diagnosis not present

## 2019-07-01 DIAGNOSIS — R197 Diarrhea, unspecified: Secondary | ICD-10-CM | POA: Diagnosis not present

## 2019-07-01 DIAGNOSIS — D239 Other benign neoplasm of skin, unspecified: Secondary | ICD-10-CM | POA: Diagnosis not present

## 2019-07-01 DIAGNOSIS — D8989 Other specified disorders involving the immune mechanism, not elsewhere classified: Secondary | ICD-10-CM | POA: Diagnosis not present

## 2019-07-01 DIAGNOSIS — G4700 Insomnia, unspecified: Secondary | ICD-10-CM | POA: Diagnosis not present

## 2019-07-03 ENCOUNTER — Other Ambulatory Visit: Payer: Self-pay

## 2019-07-03 ENCOUNTER — Telehealth: Payer: Self-pay | Admitting: Orthopedic Surgery

## 2019-07-03 DIAGNOSIS — G8929 Other chronic pain: Secondary | ICD-10-CM

## 2019-07-03 DIAGNOSIS — M25562 Pain in left knee: Secondary | ICD-10-CM

## 2019-07-03 MED FILL — MELOXICAM 15 MG TABLET: 15 | 30 days supply | Qty: 30 | Fill #2

## 2019-07-03 NOTE — Telephone Encounter (Signed)
Please advise. Per last office note from 04/2019, if patient did not get relief from injection, MRI Arthrogram Knee could be ordered.

## 2019-07-03 NOTE — Telephone Encounter (Signed)
Ordered

## 2019-07-03 NOTE — Telephone Encounter (Signed)
Patient called left voicemail message asking if she can get set up for an MRI? The number to contact patient is [phone] or [phone]

## 2019-07-08 ENCOUNTER — Encounter: Payer: Self-pay | Admitting: Orthopedic Surgery

## 2019-07-15 DIAGNOSIS — L2089 Other atopic dermatitis: Secondary | ICD-10-CM | POA: Diagnosis not present

## 2019-07-15 DIAGNOSIS — L661 Lichen planopilaris: Secondary | ICD-10-CM | POA: Diagnosis not present

## 2019-07-29 ENCOUNTER — Ambulatory Visit
Admission: RE | Admit: 2019-07-29 | Discharge: 2019-07-29 | Disposition: A | Discharge status: Home / Self Care | Payer: 59 | Source: Ambulatory Visit | Attending: Surgical | Admitting: Surgical

## 2019-07-29 ENCOUNTER — Other Ambulatory Visit: Payer: Self-pay

## 2019-07-29 DIAGNOSIS — G8929 Other chronic pain: Secondary | ICD-10-CM

## 2019-07-29 DIAGNOSIS — M1712 Unilateral primary osteoarthritis, left knee: Secondary | ICD-10-CM | POA: Diagnosis not present

## 2019-07-29 DIAGNOSIS — M25562 Pain in left knee: Secondary | ICD-10-CM | POA: Diagnosis not present

## 2019-07-29 MED ORDER — IOPAMIDOL (ISOVUE-M 200) INJECTION 41%
35.0000 mL | Freq: Once | INTRAMUSCULAR | Status: AC
  Administered 2019-07-29: 16:00:00 35 mL via INTRA_ARTICULAR

## 2019-08-05 ENCOUNTER — Encounter: Payer: Self-pay | Admitting: Orthopedic Surgery

## 2019-08-05 NOTE — Telephone Encounter (Signed)
I called her.  We talked about the meniscal tear.  MRI scan from a year ago okay with this new MRI shows fairly big radial tear of the medial meniscus.  Effusion is present.  Talk with her at length about operative and nonoperative treatment options.  In general if she is having more mechanical symptoms then pain arthroscopy is predictably helpful if is just pain and swelling arthroscopy may be less predictably helpful.  She is going to consider options and call if she wants to get scheduled.

## 2019-08-06 NOTE — Telephone Encounter (Signed)
Met size of a meniscal tear would be symptomatic.  In general arthroscopy would be indicated for symptoms that have lasted this long but your expectation should not be the type of pain relief that he got from the right knee surgery.  I think at best this could give you some pain relief and resolution of any mechanical symptoms you are having.  Based on what the tear actually looks like at the time of surgery I can predict the rate at which arthritis will come into your knee in a more accurate fashion.

## 2019-08-11 MED FILL — ZOLPIDEM TARTRATE 5 MG TAB: 5 | 30 days supply | Qty: 30 | Fill #0

## 2019-08-12 ENCOUNTER — Encounter: Payer: Self-pay | Admitting: Orthopedic Surgery

## 2019-08-12 NOTE — Telephone Encounter (Signed)
8 weeks minimum time to recover to be ready for dancing.  Exercising beforehand would be primarily stationary bike and leg extension exercises for strengthening.  I think realistically the latest that she would want to do that surgery would be end of January

## 2019-08-19 ENCOUNTER — Encounter: Payer: Self-pay | Admitting: Orthopedic Surgery

## 2019-08-19 NOTE — Telephone Encounter (Signed)
I called.  She is going to try to wait it out until April.  I did tell her just to do stationary bike and no other real vigorous exercise so the tear does not get worse.

## 2019-09-01 MED FILL — OLOPATADINE HCL 0.1 % SOLN: 0.1 | 25 days supply | Qty: 5 | Fill #1

## 2019-09-23 ENCOUNTER — Other Ambulatory Visit: Payer: Self-pay | Admitting: Orthopedic Surgery

## 2019-09-23 MED FILL — ZOLPIDEM TARTRATE 5 MG TAB: 5 | 30 days supply | Qty: 30 | Fill #0

## 2019-09-23 MED FILL — MELOXICAM 15 MG TABLET: 15 | 30 days supply | Qty: 30 | Fill #0

## 2019-09-23 NOTE — Telephone Encounter (Signed)
pls

## 2019-09-24 ENCOUNTER — Telehealth: Payer: Self-pay

## 2019-09-24 ENCOUNTER — Other Ambulatory Visit: Payer: Self-pay | Admitting: Surgical

## 2019-09-24 MED ORDER — MELOXICAM 15 MG PO TABS: 15.0000 mg | ORAL_TABLET | Freq: Every day | ORAL | 2 refills | Status: DC

## 2019-09-24 NOTE — Telephone Encounter (Signed)
Patient request rxrf on Meloxicam [employer]

## 2019-11-04 ENCOUNTER — Encounter: Payer: Self-pay | Admitting: Orthopedic Surgery

## 2019-11-06 MED FILL — OLOPATADINE HCL 0.1 % SOLN: 0.1 | 25 days supply | Qty: 5 | Fill #2

## 2019-11-07 MED FILL — ZOLPIDEM TARTRATE 5 MG TAB: 5 | 30 days supply | Qty: 30 | Fill #0

## 2019-11-07 NOTE — Telephone Encounter (Signed)
Blue sheet done pls claal htx

## 2019-11-21 ENCOUNTER — Other Ambulatory Visit: Payer: Self-pay

## 2019-11-27 MED FILL — BENZONATATE 100 MG CAPS: 100 | 10 days supply | Qty: 30 | Fill #0

## 2019-11-27 MED FILL — DOXYCYCLINE HYCLATE 100 MG: 100 | 7 days supply | Qty: 14 | Fill #0

## 2019-11-28 ENCOUNTER — Other Ambulatory Visit: Payer: Self-pay

## 2019-11-28 ENCOUNTER — Encounter (HOSPITAL_BASED_OUTPATIENT_CLINIC_OR_DEPARTMENT_OTHER): Payer: Self-pay | Admitting: Orthopedic Surgery

## 2019-12-02 ENCOUNTER — Encounter: Payer: Self-pay | Admitting: Orthopedic Surgery

## 2019-12-03 NOTE — Telephone Encounter (Signed)
I called Molly Vance in room to proceed with surgery.  I think arthroscopy with meniscal debridement could slow the rate of arthritic progression in the knee.  Particularly considering the new meniscal tear from the MRI scan 2019-2020.  All questions answered.

## 2019-12-18 ENCOUNTER — Other Ambulatory Visit (HOSPITAL_COMMUNITY): Payer: 59

## 2019-12-19 ENCOUNTER — Other Ambulatory Visit (HOSPITAL_COMMUNITY)
Admission: RE | Admit: 2019-12-19 | Discharge: 2019-12-19 | Disposition: A | Discharge status: Home / Self Care | Payer: 59 | Source: Ambulatory Visit | Attending: Orthopedic Surgery | Admitting: Orthopedic Surgery

## 2019-12-19 ENCOUNTER — Encounter (HOSPITAL_BASED_OUTPATIENT_CLINIC_OR_DEPARTMENT_OTHER)
Admission: RE | Admit: 2019-12-19 | Discharge: 2019-12-19 | Disposition: A | Discharge status: Home / Self Care | Payer: 59 | Source: Ambulatory Visit | Attending: Orthopedic Surgery | Admitting: Orthopedic Surgery

## 2019-12-19 DIAGNOSIS — Z20822 Contact with and (suspected) exposure to covid-19: Secondary | ICD-10-CM | POA: Diagnosis not present

## 2019-12-19 LAB — BASIC METABOLIC PANEL
Anion gap: 11 (ref 5–15)
BUN: 15 mg/dL (ref 6–20)
CO2: 25 mmol/L (ref 22–32)
Calcium: 9.5 mg/dL (ref 8.9–10.3)
Chloride: 105 mmol/L (ref 98–111)
Creatinine, Ser: 0.67 mg/dL (ref 0.44–1.00)
GFR calc Af Amer: >60 mL/min (ref >60)
GFR calc non Af Amer: >60 mL/min (ref >60)
Glucose, Bld: 105 mg/dL — ABNORMAL HIGH (ref 70–99)
Potassium: 4.6 mmol/L (ref 3.5–5.1)
Sodium: 141 mmol/L (ref 135–145)

## 2019-12-19 LAB — CBC
HCT: 39.5 % (ref 36.0–46.0)
Hemoglobin: 13.3 g/dL (ref 12.0–15.0)
MCH: 32 pg (ref 26.0–34.0)
MCHC: 33.7 g/dL (ref 30.0–36.0)
MCV: 95 fL (ref 80.0–100.0)
Platelets: 217 10*3/uL (ref 150–400)
RBC: 4.16 MIL/uL (ref 3.87–5.11)
RDW: 11.5 % (ref 11.5–15.5)
WBC: 4 10*3/uL (ref 4.0–10.5)
nRBC: 0 % (ref 0.0–0.2)

## 2019-12-19 LAB — SARS CORONAVIRUS 2 (TAT 6-24 HRS): SARS Coronavirus 2: NEGATIVE

## 2019-12-19 NOTE — Progress Notes (Signed)
Left message for pt to come in for lab work.  

## 2019-12-19 NOTE — Progress Notes (Signed)

## 2019-12-22 ENCOUNTER — Ambulatory Visit (HOSPITAL_BASED_OUTPATIENT_CLINIC_OR_DEPARTMENT_OTHER): Payer: 59 | Admitting: Anesthesiology

## 2019-12-22 ENCOUNTER — Encounter (HOSPITAL_BASED_OUTPATIENT_CLINIC_OR_DEPARTMENT_OTHER)
Admission: RE | Disposition: A | Discharge status: Home / Self Care | Payer: Self-pay | Source: Home / Self Care | Attending: Orthopedic Surgery

## 2019-12-22 ENCOUNTER — Encounter (HOSPITAL_BASED_OUTPATIENT_CLINIC_OR_DEPARTMENT_OTHER): Payer: Self-pay | Admitting: Orthopedic Surgery

## 2019-12-22 ENCOUNTER — Ambulatory Visit (HOSPITAL_BASED_OUTPATIENT_CLINIC_OR_DEPARTMENT_OTHER)
Admission: RE | Admit: 2019-12-22 | Discharge: 2019-12-22 | Disposition: A | Discharge status: Home / Self Care | Payer: 59 | Attending: Orthopedic Surgery | Admitting: Orthopedic Surgery

## 2019-12-22 DIAGNOSIS — Z881 Allergy status to other antibiotic agents status: Secondary | ICD-10-CM | POA: Insufficient documentation

## 2019-12-22 DIAGNOSIS — S83242A Other tear of medial meniscus, current injury, left knee, initial encounter: Secondary | ICD-10-CM | POA: Insufficient documentation

## 2019-12-22 DIAGNOSIS — Z791 Long term (current) use of non-steroidal anti-inflammatories (NSAID): Secondary | ICD-10-CM | POA: Insufficient documentation

## 2019-12-22 DIAGNOSIS — Z833 Family history of diabetes mellitus: Secondary | ICD-10-CM | POA: Insufficient documentation

## 2019-12-22 DIAGNOSIS — Z888 Allergy status to other drugs, medicaments and biological substances status: Secondary | ICD-10-CM | POA: Diagnosis not present

## 2019-12-22 DIAGNOSIS — Z8249 Family history of ischemic heart disease and other diseases of the circulatory system: Secondary | ICD-10-CM | POA: Insufficient documentation

## 2019-12-22 DIAGNOSIS — S83242D Other tear of medial meniscus, current injury, left knee, subsequent encounter: Secondary | ICD-10-CM | POA: Diagnosis not present

## 2019-12-22 DIAGNOSIS — M94262 Chondromalacia, left knee: Secondary | ICD-10-CM | POA: Insufficient documentation

## 2019-12-22 DIAGNOSIS — X58XXXA Exposure to other specified factors, initial encounter: Secondary | ICD-10-CM | POA: Diagnosis not present

## 2019-12-22 DIAGNOSIS — Z803 Family history of malignant neoplasm of breast: Secondary | ICD-10-CM | POA: Diagnosis not present

## 2019-12-22 DIAGNOSIS — G4700 Insomnia, unspecified: Secondary | ICD-10-CM | POA: Insufficient documentation

## 2019-12-22 DIAGNOSIS — Z882 Allergy status to sulfonamides status: Secondary | ICD-10-CM | POA: Insufficient documentation

## 2019-12-22 DIAGNOSIS — E785 Hyperlipidemia, unspecified: Secondary | ICD-10-CM | POA: Diagnosis not present

## 2019-12-22 DIAGNOSIS — Z880 Allergy status to penicillin: Secondary | ICD-10-CM | POA: Diagnosis not present

## 2019-12-22 HISTORY — PX: KNEE ARTHROSCOPY WITH MEDIAL MENISECTOMY: SHX5651

## 2019-12-22 HISTORY — DX: Other tear of medial meniscus, current injury, unspecified knee, initial encounter: S83.249A

## 2019-12-22 SURGERY — ARTHROSCOPY, KNEE, WITH MEDIAL MENISCECTOMY
Anesthesia: General | Site: Knee | Laterality: Left

## 2019-12-22 MED ORDER — SODIUM CHLORIDE 0.9 % IR SOLN: Status: DC | PRN

## 2019-12-22 MED ORDER — CLONIDINE HCL (ANALGESIA) 100 MCG/ML EP SOLN
EPIDURAL | Status: AC
  Filled 2019-12-22: qty 10

## 2019-12-22 MED ORDER — MEPERIDINE HCL 25 MG/ML IJ SOLN: 6.2500 mg | INTRAMUSCULAR | Status: DC | PRN

## 2019-12-22 MED ORDER — MIDAZOLAM HCL 2 MG/2ML IJ SOLN
INTRAMUSCULAR | Status: AC
  Filled 2019-12-22: qty 2

## 2019-12-22 MED ORDER — PROPOFOL 10 MG/ML IV BOLUS
INTRAVENOUS | Status: DC | PRN
  Administered 2019-12-22: 150 mg via INTRAVENOUS

## 2019-12-22 MED ORDER — CLONIDINE HCL (ANALGESIA) 100 MCG/ML EP SOLN
EPIDURAL | Status: DC | PRN
  Administered 2019-12-22: .75 mL

## 2019-12-22 MED ORDER — MORPHINE SULFATE (PF) 4 MG/ML IV SOLN
INTRAVENOUS | Status: AC
  Filled 2019-12-22: qty 2

## 2019-12-22 MED ORDER — FENTANYL CITRATE (PF) 100 MCG/2ML IJ SOLN
50.0000 ug | INTRAMUSCULAR | Status: AC | PRN
  Administered 2019-12-22: 13:00:00 50 ug via INTRAVENOUS
  Administered 2019-12-22 (×2): 25 ug via INTRAVENOUS
  Administered 2019-12-22: 13:00:00 50 ug via INTRAVENOUS

## 2019-12-22 MED ORDER — OXYCODONE HCL 5 MG PO TABS: 5.0000 mg | ORAL_TABLET | ORAL | 0 refills | Status: AC | PRN

## 2019-12-22 MED ORDER — KETOROLAC TROMETHAMINE 30 MG/ML IJ SOLN: 30.0000 mg | Freq: Once | INTRAMUSCULAR | Status: DC | PRN

## 2019-12-22 MED ORDER — MIDAZOLAM HCL 5 MG/5ML IJ SOLN
INTRAMUSCULAR | Status: DC | PRN
  Administered 2019-12-22: 2 mg via INTRAVENOUS

## 2019-12-22 MED ORDER — MIDAZOLAM HCL 2 MG/2ML IJ SOLN: 1.0000 mg | INTRAMUSCULAR | Status: DC | PRN

## 2019-12-22 MED ORDER — OXYCODONE HCL 5 MG/5ML PO SOLN: 5.0000 mg | Freq: Once | ORAL | Status: DC | PRN

## 2019-12-22 MED ORDER — LIDOCAINE 2% (20 MG/ML) 5 ML SYRINGE
INTRAMUSCULAR | Status: AC
  Filled 2019-12-22: qty 5

## 2019-12-22 MED ORDER — OXYCODONE HCL 5 MG PO TABS: 5.0000 mg | ORAL_TABLET | Freq: Once | ORAL | Status: DC | PRN

## 2019-12-22 MED ORDER — ONDANSETRON HCL 4 MG/2ML IJ SOLN
INTRAMUSCULAR | Status: DC | PRN
  Administered 2019-12-22: 4 mg via INTRAVENOUS

## 2019-12-22 MED ORDER — LIDOCAINE-EPINEPHRINE 1 %-1:100000 IJ SOLN
INTRAMUSCULAR | Status: DC | PRN
  Administered 2019-12-22: 10 mL

## 2019-12-22 MED ORDER — PROMETHAZINE HCL 25 MG/ML IJ SOLN: 6.2500 mg | INTRAMUSCULAR | Status: DC | PRN

## 2019-12-22 MED ORDER — POVIDONE-IODINE 7.5 % EX SOLN: Freq: Once | CUTANEOUS | Status: DC

## 2019-12-22 MED ORDER — FENTANYL CITRATE (PF) 100 MCG/2ML IJ SOLN
INTRAMUSCULAR | Status: AC
  Filled 2019-12-22: qty 2

## 2019-12-22 MED ORDER — HYDROMORPHONE HCL 1 MG/ML IJ SOLN: 0.2500 mg | INTRAMUSCULAR | Status: DC | PRN

## 2019-12-22 MED ORDER — METHOCARBAMOL 500 MG PO TABS: 500.0000 mg | ORAL_TABLET | Freq: Three times a day (TID) | ORAL | 0 refills | Status: AC | PRN

## 2019-12-22 MED ORDER — EPINEPHRINE PF 1 MG/ML IJ SOLN
INTRAMUSCULAR | Status: AC
  Filled 2019-12-22: qty 1

## 2019-12-22 MED ORDER — MORPHINE SULFATE (PF) 4 MG/ML IV SOLN
INTRAVENOUS | Status: DC | PRN
  Administered 2019-12-22: 8 mg via INTRAVENOUS

## 2019-12-22 MED ORDER — VANCOMYCIN HCL IN DEXTROSE 1-5 GM/200ML-% IV SOLN
INTRAVENOUS | Status: AC
  Filled 2019-12-22: qty 200

## 2019-12-22 MED ORDER — VANCOMYCIN HCL IN DEXTROSE 1-5 GM/200ML-% IV SOLN
1000.0000 mg | INTRAVENOUS | Status: AC
  Administered 2019-12-22: 1000 mg via INTRAVENOUS

## 2019-12-22 MED ORDER — LACTATED RINGERS IV SOLN: INTRAVENOUS | Status: DC

## 2019-12-22 MED ORDER — VANCOMYCIN HCL 1000 MG IV SOLR
INTRAVENOUS | Status: DC | PRN
  Administered 2019-12-22: 1000 mg via INTRAVENOUS

## 2019-12-22 MED ORDER — ACETAMINOPHEN 500 MG PO TABS: 1000.0000 mg | ORAL_TABLET | Freq: Once | ORAL | Status: DC

## 2019-12-22 MED ORDER — LIDOCAINE 2% (20 MG/ML) 5 ML SYRINGE
INTRAMUSCULAR | Status: DC | PRN
  Administered 2019-12-22: 60 mg via INTRAVENOUS

## 2019-12-22 MED ORDER — ASPIRIN EC 81 MG PO TBEC: 81.0000 mg | DELAYED_RELEASE_TABLET | Freq: Every day | ORAL | 0 refills | Status: AC

## 2019-12-22 MED ORDER — DEXAMETHASONE SODIUM PHOSPHATE 4 MG/ML IJ SOLN
INTRAMUSCULAR | Status: DC | PRN
  Administered 2019-12-22: 4 mg via INTRAVENOUS

## 2019-12-22 MED ORDER — BUPIVACAINE HCL (PF) 0.5 % IJ SOLN
INTRAMUSCULAR | Status: DC | PRN
  Administered 2019-12-22: 20 mL

## 2019-12-22 MED FILL — ZOLPIDEM TARTRATE 5 MG TAB: 5 | 30 days supply | Qty: 30 | Fill #0

## 2019-12-22 MED FILL — oxyCODONE HCL 5 MG TABS: 5 | 5 days supply | Qty: 30 | Fill #0

## 2019-12-22 MED FILL — METHOCARBAMOL 500 MG TABS: 500 | 10 days supply | Qty: 30 | Fill #0

## 2019-12-22 SURGICAL SUPPLY — 58 items
BANDAGE ESMARK 6X9 LF (GAUZE/BANDAGES/DRESSINGS) IMPLANT
BLADE EXCALIBUR 4.0MM X 13CM (MISCELLANEOUS)
BLADE EXCALIBUR 4.0X13 (MISCELLANEOUS) IMPLANT
BLADE SURG 15 STRL LF DISP TIS (BLADE) IMPLANT
BLADE SURG 15 STRL SS (BLADE)
BNDG CMPR 9X6 STRL LF SNTH (GAUZE/BANDAGES/DRESSINGS)
BNDG ELASTIC 4X5.8 VLCR STR LF (GAUZE/BANDAGES/DRESSINGS) ×3 IMPLANT
BNDG ELASTIC 6X5.8 VLCR STR LF (GAUZE/BANDAGES/DRESSINGS) ×3 IMPLANT
BNDG ESMARK 6X9 LF (GAUZE/BANDAGES/DRESSINGS)
COVER WAND RF STERILE (DRAPES) ×2 IMPLANT
CUFF TOURN SGL QUICK 34 (TOURNIQUET CUFF)
CUFF TRNQT CYL 34X4.125X (TOURNIQUET CUFF) IMPLANT
DISSECTOR  3.8MM X 13CM (MISCELLANEOUS) ×3
DISSECTOR 3.8MM X 13CM (MISCELLANEOUS) ×1 IMPLANT
DRAPE ARTHROSCOPY W/POUCH 90 (DRAPES) ×3 IMPLANT
DRAPE INCISE IOBAN 66X45 STRL (DRAPES) IMPLANT
DRAPE U-SHAPE 47X51 STRL (DRAPES) ×4 IMPLANT
DRAPE U-SHAPE 76X120 STRL (DRAPES) ×1 IMPLANT
DRSG TEGADERM 2-3/8X2-3/4 SM (GAUZE/BANDAGES/DRESSINGS) IMPLANT
DRSG TEGADERM 4X4.75 (GAUZE/BANDAGES/DRESSINGS) ×6 IMPLANT
DURAPREP 26ML APPLICATOR (WOUND CARE) ×3 IMPLANT
DW OUTFLOW CASSETTE/TUBE SET (MISCELLANEOUS) ×3 IMPLANT
ELECT REM PT RETURN 9FT ADLT (ELECTROSURGICAL)
ELECTRODE REM PT RTRN 9FT ADLT (ELECTROSURGICAL) IMPLANT
EXCALIBUR 3.8MM X 13CM (MISCELLANEOUS) IMPLANT
GAUZE 4X4 16PLY RFD (DISPOSABLE) IMPLANT
GAUZE SPONGE 4X4 12PLY STRL (GAUZE/BANDAGES/DRESSINGS) ×3 IMPLANT
GAUZE XEROFORM 1X8 LF (GAUZE/BANDAGES/DRESSINGS) ×3 IMPLANT
GLOVE BIOGEL PI IND STRL 7.5 (GLOVE) IMPLANT
GLOVE BIOGEL PI IND STRL 8 (GLOVE) ×1 IMPLANT
GLOVE BIOGEL PI INDICATOR 7.5 (GLOVE) ×2
GLOVE BIOGEL PI INDICATOR 8 (GLOVE) ×2
GLOVE SURG ORTHO 8.0 STRL STRW (GLOVE) ×3 IMPLANT
GLOVE SURG SS PI 7.5 STRL IVOR (GLOVE) ×2 IMPLANT
GOWN STRL REUS W/ TWL LRG LVL3 (GOWN DISPOSABLE) ×1 IMPLANT
GOWN STRL REUS W/ TWL XL LVL3 (GOWN DISPOSABLE) ×1 IMPLANT
GOWN STRL REUS W/TWL LRG LVL3 (GOWN DISPOSABLE) ×3
GOWN STRL REUS W/TWL XL LVL3 (GOWN DISPOSABLE) ×3
HOLDER KNEE FOAM BLUE (MISCELLANEOUS) IMPLANT
KNEE WRAP E Z 3 GEL PACK (MISCELLANEOUS) ×3 IMPLANT
MANIFOLD NEPTUNE II (INSTRUMENTS) ×3 IMPLANT
NDL HYPO 18GX1.5 BLUNT FILL (NEEDLE) ×1 IMPLANT
NDL SAFETY ECLIPSE 18X1.5 (NEEDLE) ×2 IMPLANT
NEEDLE HYPO 18GX1.5 BLUNT FILL (NEEDLE) ×3 IMPLANT
NEEDLE HYPO 18GX1.5 SHARP (NEEDLE) ×6
PACK ARTHROSCOPY DSU (CUSTOM PROCEDURE TRAY) ×3 IMPLANT
PACK BASIN DAY SURGERY FS (CUSTOM PROCEDURE TRAY) ×3 IMPLANT
PADDING CAST COTTON 6X4 STRL (CAST SUPPLIES) ×3 IMPLANT
PENCIL SMOKE EVACUATOR (MISCELLANEOUS) IMPLANT
PORT APPOLLO RF 90DEGREE MULTI (SURGICAL WAND) ×2 IMPLANT
SUT ETHILON 3 0 PS 1 (SUTURE) ×3 IMPLANT
SUT MENISCAL KIT (KITS) IMPLANT
SUT VIC AB 3-0 FS2 27 (SUTURE) IMPLANT
SYR 5ML LL (SYRINGE) ×3 IMPLANT
SYR TB 1ML LL NO SAFETY (SYRINGE) ×3 IMPLANT
TOWEL GREEN STERILE FF (TOWEL DISPOSABLE) ×3 IMPLANT
TRAY DSU PREP LF (CUSTOM PROCEDURE TRAY) ×3 IMPLANT
TUBING ARTHROSCOPY IRRIG 16FT (MISCELLANEOUS) ×3 IMPLANT

## 2019-12-22 NOTE — Brief Op Note (Signed)
   12/22/2019  1:48 PM  PATIENT:  Molly Vance  59 y.o. female  PRE-OPERATIVE DIAGNOSIS:  LEFT KNEE MEDIAL MENISCAL TEAR  POST-OPERATIVE DIAGNOSIS:  LEFT KNEE MEDIAL MENISCAL TEAR  PROCEDURE:  Procedure(s): LEFT KNEE ARTHROSCOPY, DEBRIDEMENT, WITH PARTIAL MEDIAL MENISECTOMY  SURGEON:  Surgeon(s): Marlou Sa, Tonna Corner, MD  ASSISTANT: magnant pa  ANESTHESIA:   general  EBL: 3 ml    Total I/O In: 250 [IV Piggyback:250] Out: -   BLOOD ADMINISTERED: none  DRAINS: none   LOCAL MEDICATIONS USED:  Marcaine ms04 clonidine  SPECIMEN:  No Specimen  COUNTS:  YES  TOURNIQUET:  * Missing tourniquet times found for documented tourniquets in log: [id number] *  DICTATION: .Other Dictation: Dictation Number [id number]  PLAN OF CARE: Discharge to home after PACU  PATIENT DISPOSITION:  PACU - hemodynamically stable

## 2019-12-22 NOTE — Anesthesia Procedure Notes (Signed)
Procedure Name: LMA Insertion Date/Time: 12/22/2019 1:03 PM Performed by: Maryella Shivers, CRNA Pre-anesthesia Checklist: Patient identified, Emergency Drugs available, Suction available and Patient being monitored Patient Re-evaluated:Patient Re-evaluated prior to induction Oxygen Delivery Method: Circle system utilized Preoxygenation: Pre-oxygenation with 100% oxygen Induction Type: IV induction Ventilation: Mask ventilation without difficulty LMA: LMA inserted LMA Size: 4.0 Number of attempts: 1 Airway Equipment and Method: Bite block Placement Confirmation: positive ETCO2 Tube secured with: Tape Dental Injury: Teeth and Oropharynx as per pre-operative assessment

## 2019-12-22 NOTE — Discharge Instructions (Signed)

## 2019-12-22 NOTE — Anesthesia Preprocedure Evaluation (Signed)
Anesthesia Evaluation  Patient identified by MRN, date of birth, ID band Patient awake    Reviewed: Allergy & Precautions, NPO status , Patient's Chart, lab work & pertinent test results  History of Anesthesia Complications Negative for: history of anesthetic complications  Airway Mallampati: I  TM Distance: >3 FB Neck ROM: Full    Dental  (+) Teeth Intact, Dental Advisory Given   Pulmonary neg pulmonary ROS,    breath sounds clear to auscultation       Cardiovascular negative cardio ROS   Rhythm:Regular  HLD   Neuro/Psych  Headaches, negative psych ROS   GI/Hepatic negative GI ROS, (+)     substance abuse  alcohol use,   Endo/Other  negative endocrine ROS  Renal/GU negative Renal ROS  negative genitourinary   Musculoskeletal Left knee medial meniscus tear   Abdominal Normal abdominal exam  (+)   Peds  Hematology negative hematology ROS (+)   Anesthesia Other Findings   Reproductive/Obstetrics negative OB ROS                             Anesthesia Physical Anesthesia Plan  ASA: I  Anesthesia Plan: General   Post-op Pain Management:    Induction: Intravenous  PONV Risk Score and Plan: 3 and Ondansetron, Dexamethasone, Midazolam and Treatment may vary due to age or medical condition  Airway Management Planned: LMA  Additional Equipment: None  Intra-op Plan:   Post-operative Plan: Extubation in OR  Informed Consent: I have reviewed the patients History and Physical, chart, labs and discussed the procedure including the risks, benefits and alternatives for the proposed anesthesia with the patient or authorized representative who has indicated his/her understanding and acceptance.     Dental advisory given  Plan Discussed with: CRNA  Anesthesia Plan Comments:         Anesthesia Quick Evaluation

## 2019-12-22 NOTE — H&P (Signed)
Molly Vance is an 59 y.o. female.   Chief Complaint: Left knee pain HPI: Molly Vance is an active 59 year old patient with left knee pain.  She developed left knee pain and swelling several years ago.  MRI scan initially after onset of symptoms demonstrated degeneration within the meniscus but no frank tearing.  She subsequently has gone on to develop worsening symptoms and repeat MRI scanning does show worsening meniscal pathology.  Presents now for operative management after persistent effusion and mechanical symptoms in the left knee with failure of conservative management options.  No personal or family history of DVT or pulmonary embolism.  Past Medical History:  Diagnosis Date  . Allergy   . Headache   . Hyperlipidemia   . Insomnia   . Menopause   . MMT (medial meniscus tear)    left  . Wears glasses     Past Surgical History:  Procedure Laterality Date  . BUNIONECTOMY  1984  . CESAREAN SECTION  09/23/88  . COLONOSCOPY    . KNEE ARTHROSCOPY WITH MEDIAL MENISECTOMY Right 10/27/2014   Procedure: KNEE ARTHROSCOPY WITH PARTIAL MEDIAL MENISECTOMY, CYST DECOMPRESSION.;  Surgeon: Meredith Pel, MD;  Location: [facility];  Service: Orthopedics;  Laterality: Right;  . TONSILLECTOMY  1983    Family History  Problem Relation Age of Onset  . Diabetes Mother   . Hypertension Mother   . Diabetes Sister   . Diabetes Maternal Grandmother   . Breast cancer Sister   . Ulcerative colitis Sister   . Crohn's disease Sister   . Colon cancer Neg Hx    Social History:  reports that she has never smoked. She has never used smokeless tobacco. She reports current alcohol use of about 6.0 standard drinks of alcohol per week. She reports that she does not use drugs.  Allergies:  Allergies  Allergen Reactions  . Amoxicillin-Pot Clavulanate Hives  . Cefprozil Swelling    lips  . Septra [Bactrim] Swelling    lips  . Neosporin [Neomycin-Polymyxin-Gramicidin] Rash    redness  . Polysporin  [Bacitracin-Polymyxin B] Rash    redness    Medications Prior to Admission  Medication Sig Dispense Refill  . Cholecalciferol (VITAMIN D3) 1000 UNITS CAPS Take 1 capsule by mouth daily.      . cycloSPORINE (RESTASIS) 0.05 % ophthalmic emulsion Place 1 drop into both eyes 2 (two) times daily.      Marland Kitchen estradiol (ESTRACE) 0.1 MG/GM vaginal cream Place 1 g vaginally 2 (two) times a week.      . fluticasone (FLONASE) 50 MCG/ACT nasal spray Place 2 sprays into the nose daily.      Marland Kitchen loratadine (CLARITIN) 10 MG tablet Take 10 mg by mouth daily.      Marland Kitchen olopatadine (PATANOL) 0.1 % ophthalmic solution Place 1 drop into both eyes 2 (two) times daily.      . OMEGA 3 1000 MG CAPS Take 2 capsules by mouth daily.      Marland Kitchen zolpidem (AMBIEN) 5 MG tablet Take 5 mg by mouth at bedtime as needed for sleep.    . meloxicam (MOBIC) 15 MG tablet Take 1 tablet (15 mg total) by mouth daily. 30 tablet 2    No results found for this or any previous visit (from the past 48 hour(s)). No results found.  Review of Systems  Musculoskeletal: Positive for arthralgias.  All other systems reviewed and are negative.   Blood pressure (!) 149/70, pulse 95, temperature (!) 97.3 F (36.3 C), temperature source  Temporal, height 5\' 5"  (1.651 m), weight 68.8 kg, SpO2 100 %. Physical Exam  Constitutional: She appears well-developed.  HENT:  Head: Normocephalic.  Eyes: Pupils are equal, round, and reactive to light.  Cardiovascular: Normal rate.  Respiratory: Effort normal.  Musculoskeletal:     Cervical back: Normal range of motion.  Neurological: She is alert.  Skin: Skin is warm.  Psychiatric: She has a normal mood and affect.  Examination of the left knee demonstrates mild effusion with medial joint line tenderness.  Collateral and cruciate ligaments are stable.  Extensor mechanism is intact.  Assessment/Plan Impression is left knee meniscal tear.  Patient had right knee meniscal tear several years ago.  Did well with  arthroscopic debridement.  This tear may be slightly more severe.  Discussed the risk and benefits of surgery including but limited to infection nerve vessel damage potential for longer recovery as well as development of arthritis in the knee.  Patient understands the risk and benefits.  All questions answered.  Anderson Malta, MD 12/22/2019, 12:27 PM

## 2019-12-22 NOTE — Anesthesia Postprocedure Evaluation (Signed)
Anesthesia Post Note  Patient: Molly Vance  Procedure(s) Performed: LEFT KNEE ARTHROSCOPY, DEBRIDEMENT, WITH PARTIAL MEDIAL MENISECTOMY (Left Knee)     Patient location during evaluation: PACU Anesthesia Type: General Level of consciousness: awake and alert, oriented and patient cooperative Pain management: pain level controlled Vital Signs Assessment: post-procedure vital signs reviewed and stable Respiratory status: spontaneous breathing, nonlabored ventilation and respiratory function stable Cardiovascular status: blood pressure returned to baseline and stable Postop Assessment: no apparent nausea or vomiting Anesthetic complications: no    Last Vitals:  Vitals:   12/22/19 1400 12/22/19 1415  BP: (!) 143/87   Pulse: (!) 111 85  Resp: 19 (!) 22  Temp:    SpO2: 100% 99%    Last Pain:  Vitals:   12/22/19 1358  TempSrc:   PainSc: 0-No pain                 Pervis Hocking

## 2019-12-22 NOTE — Op Note (Signed)
NAME: Molly Vance, Molly Vance MEDICAL RECORD [id number] ACCOUNT [id number] DATE OF BIRTH:July 19, 1961 FACILITY: [facility] LOCATION: [facility] PHYSICIAN:Kenyada Dosch Randel Pigg, MD  OPERATIVE REPORT  DATE OF PROCEDURE:  12/22/2019  PREOPERATIVE DIAGNOSIS:  Left knee medial meniscal tear.  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  PROCEDURE:  Left knee arthroscopy with partial medial meniscectomy.  SURGEON:  Meredith Pel, MD  ASSISTANT:  Annie Main, PA.  INDICATIONS:  The patient is a 59 year old patient with left knee pain and medial meniscal tear by MRI scan.  She has failed conservative management.  She presents now for operative management after explanation of risks and benefits.  PROCEDURE IN DETAIL:  The patient was brought to the operating room where general anesthetic was induced.  Preoperative antibiotics administered.  Timeout was called.  Left leg was examined under anesthesia and was found to have full range of motion,  stable collateral and cruciate ligaments with intact extensor mechanism.  Left leg was then prescrubbed with alcohol and Betadine, prepped with DuraPrep solution and draped in a sterile manner.  After calling timeout, anterior inferomedial portal was  established under direct visualization.  Diagnostic arthroscopy demonstrated intact ACL, PCL intact, intact lateral compartment, articular cartilage and meniscus, although she did have a little bit of softening there in the midportion of the lateral  tibial plateau.  The patient had grade I chondromalacia on the undersurface of the medial and lateral facets of the patella, but no corresponding chondral changes.  That was in the trochlea.  The medial and lateral gutters no loose bodies.  Medial  compartment demonstrated tear of the medial meniscus, which was a radial tear involving about 65%, anterior, posterior width of the meniscus.  Grade III  chondromalacia present over 75% of the medial femoral condyle and grade I chondromalacia on the  medial tibial plateau.  Meniscal tear was debrided using a combination of basket punch and shaver.  All in all, a decent rim remained.  Next, thorough irrigation of the knee joint was performed.  Instruments were removed.  Portals closed using 3-0 nylon.   Solution of Marcaine, morphine, clonidine injected into the knee for postop pain relief.  Portals closed using 3-0 nylon.  An impervious dressing and bulky Ace wrap placed.  The patient tolerated the procedure well without immediate complication.   [name]'s assistance was helpful for limb positioning as well as opening and closing.  His assistance was a medical necessity.  VN/NUANCE  D:12/22/2019 T:12/22/2019 JOB:[id number]

## 2019-12-22 NOTE — Transfer of Care (Signed)
Immediate Anesthesia Transfer of Care Note  Patient: Molly Vance  Procedure(s) Performed: LEFT KNEE ARTHROSCOPY, DEBRIDEMENT, WITH PARTIAL MEDIAL MENISECTOMY (Left Knee)  Patient Location: PACU  Anesthesia Type:General  Level of Consciousness: sedated  Airway & Oxygen Therapy: Patient Spontanous Breathing and Patient connected to face mask oxygen  Post-op Assessment: Report given to RN and Post -op Vital signs reviewed and stable  Post vital signs: Reviewed and stable  Last Vitals:  Vitals Value Taken Time  BP 118/65 12/22/19 1353  Temp    Pulse 85 12/22/19 1356  Resp 11 12/22/19 1356  SpO2 100 % 12/22/19 1356  Vitals shown include unvalidated device data.  Last Pain:  Vitals:   12/22/19 1141  TempSrc: Temporal  PainSc: 0-No pain         Complications: No apparent anesthesia complications

## 2019-12-23 ENCOUNTER — Encounter: Payer: Self-pay | Admitting: *Deleted

## 2019-12-24 ENCOUNTER — Encounter: Payer: Self-pay | Admitting: Orthopedic Surgery

## 2019-12-29 ENCOUNTER — Ambulatory Visit (INDEPENDENT_AMBULATORY_CARE_PROVIDER_SITE_OTHER): Payer: 59 | Admitting: Orthopedic Surgery

## 2019-12-29 ENCOUNTER — Other Ambulatory Visit: Payer: Self-pay

## 2019-12-29 ENCOUNTER — Encounter: Payer: Self-pay | Admitting: Orthopedic Surgery

## 2019-12-29 ENCOUNTER — Ambulatory Visit (HOSPITAL_BASED_OUTPATIENT_CLINIC_OR_DEPARTMENT_OTHER)
Admission: RE | Admit: 2019-12-29 | Discharge: 2019-12-29 | Disposition: A | Discharge status: Home / Self Care | Payer: 59 | Source: Ambulatory Visit | Attending: Orthopedic Surgery | Admitting: Orthopedic Surgery

## 2019-12-29 DIAGNOSIS — I82442 Acute embolism and thrombosis of left tibial vein: Secondary | ICD-10-CM | POA: Diagnosis not present

## 2019-12-29 DIAGNOSIS — M25462 Effusion, left knee: Secondary | ICD-10-CM | POA: Diagnosis not present

## 2019-12-29 DIAGNOSIS — S838X2A Sprain of other specified parts of left knee, initial encounter: Secondary | ICD-10-CM

## 2019-12-29 MED ORDER — RIVAROXABAN (XARELTO) VTE STARTER PACK (15 & 20 MG): ORAL_TABLET | ORAL | 0 refills | Status: DC

## 2019-12-30 DIAGNOSIS — D225 Melanocytic nevi of trunk: Secondary | ICD-10-CM | POA: Diagnosis not present

## 2019-12-30 DIAGNOSIS — L720 Epidermal cyst: Secondary | ICD-10-CM | POA: Diagnosis not present

## 2019-12-30 DIAGNOSIS — D2261 Melanocytic nevi of right upper limb, including shoulder: Secondary | ICD-10-CM | POA: Diagnosis not present

## 2019-12-30 DIAGNOSIS — D2271 Melanocytic nevi of right lower limb, including hip: Secondary | ICD-10-CM | POA: Diagnosis not present

## 2019-12-30 DIAGNOSIS — L661 Lichen planopilaris: Secondary | ICD-10-CM | POA: Diagnosis not present

## 2019-12-30 DIAGNOSIS — L821 Other seborrheic keratosis: Secondary | ICD-10-CM | POA: Diagnosis not present

## 2020-01-02 ENCOUNTER — Encounter: Payer: Self-pay | Admitting: Orthopedic Surgery

## 2020-01-02 NOTE — Progress Notes (Signed)
Post-Op Visit Note   Patient: Molly Vance           Date of Birth: 1960-12-13           MRN: [id number] Visit Date: 12/29/2019 PCP: Shon Baton, MD   Assessment & Plan:  Chief Complaint:  Chief Complaint  Patient presents with  . Left Knee - Routine Post Op   Visit Diagnoses:  1. Effusion, left knee   2. Injury of meniscus of left knee, initial encounter     Plan: Patient is a 59 year old female who presents s/p left knee arthroscopy with debridement and partial medial meniscectomy.  Knee feels much improved compared with preop.  Sutures are intact.  She has been full weightbearing without a walker/cane/crutches.  0 to 5 degrees of extension with greater than 90 degrees of flexion.  No effusion is present on exam.  She is taking only Advil as needed and has been compliant with taking the aspirin daily.  Ordered ultrasound of the left lower extremity to rule out DVT as patient does have some calf tenderness today as well as a palpable knot in the medial distal calf.  We will discontinue aspirin if this is negative.  Ultrasound did show a DVT in the calf.  Patient was instructed to follow-up with her primary care physician by the end of the week.  Additionally, she was started on Xarelto.  She may resume normal activities as she can tolerate after about 24 hours of being on the Xarelto.  Follow-up in 4 weeks for clinical recheck.  Patient may return to sedentary work on 4/28.  Follow-Up Instructions: No follow-ups on file.   Orders:  Orders Placed This Encounter  Procedures  . US Venous Img Lower Unilateral Left (DVT)   Meds ordered this encounter  Medications  . RIVAROXABAN (XARELTO) VTE STARTER PACK (15 & 20 MG TABLETS)    Sig: Follow package directions: Take one 15mg  tablet by mouth twice a day. On day 22, switch to one 20mg  tablet once a day. Take with food.    Dispense:  51 each    Refill:  0    Imaging: No results found.  PMFS History: Patient Active Problem List     Diagnosis Date Noted  . Menopausal hot flushes 07/12/2011  . Menopause   . Allergy   . Hyperlipidemia    Past Medical History:  Diagnosis Date  . Allergy   . Headache   . Hyperlipidemia   . Insomnia   . Menopause   . MMT (medial meniscus tear)    left  . Wears glasses     Family History  Problem Relation Age of Onset  . Diabetes Mother   . Hypertension Mother   . Diabetes Sister   . Diabetes Maternal Grandmother   . Breast cancer Sister   . Ulcerative colitis Sister   . Crohn's disease Sister   . Colon cancer Neg Hx     Past Surgical History:  Procedure Laterality Date  . BUNIONECTOMY  1984  . CESAREAN SECTION  09/23/88  . COLONOSCOPY    . KNEE ARTHROSCOPY WITH MEDIAL MENISECTOMY Right 10/27/2014   Procedure: KNEE ARTHROSCOPY WITH PARTIAL MEDIAL MENISECTOMY, CYST DECOMPRESSION.;  Surgeon: Meredith Pel, MD;  Location: [facility];  Service: Orthopedics;  Laterality: Right;  . KNEE ARTHROSCOPY WITH MEDIAL MENISECTOMY Left 12/22/2019   Procedure: LEFT KNEE ARTHROSCOPY, DEBRIDEMENT, WITH PARTIAL MEDIAL MENISECTOMY;  Surgeon: Meredith Pel, MD;  Location: [facility];  Service:  Orthopedics;  Laterality: Left;  . TONSILLECTOMY  1983   Social History   Occupational History  . Not on file  Tobacco Use  . Smoking status: Never Smoker  . Smokeless tobacco: Never Used  Substance and Sexual Activity  . Alcohol use: Yes    Alcohol/week: 6.0 standard drinks    Types: 6 Glasses of wine per week    Comment: occasional  . Drug use: No  . Sexual activity: Yes    Birth control/protection: Post-menopausal

## 2020-01-03 ENCOUNTER — Encounter: Payer: Self-pay | Admitting: Orthopedic Surgery

## 2020-01-05 DIAGNOSIS — Z7901 Long term (current) use of anticoagulants: Secondary | ICD-10-CM | POA: Diagnosis not present

## 2020-01-05 DIAGNOSIS — M25562 Pain in left knee: Secondary | ICD-10-CM | POA: Diagnosis not present

## 2020-01-05 DIAGNOSIS — I82409 Acute embolism and thrombosis of unspecified deep veins of unspecified lower extremity: Secondary | ICD-10-CM | POA: Diagnosis not present

## 2020-01-05 MED FILL — XARELTO 20 MG TABLET: 20 | 90 days supply | Qty: 90 | Fill #0

## 2020-01-26 ENCOUNTER — Ambulatory Visit: Payer: 59 | Admitting: Orthopedic Surgery

## 2020-01-28 ENCOUNTER — Other Ambulatory Visit: Payer: Self-pay

## 2020-01-28 ENCOUNTER — Ambulatory Visit (INDEPENDENT_AMBULATORY_CARE_PROVIDER_SITE_OTHER): Payer: 59 | Admitting: Orthopedic Surgery

## 2020-01-28 ENCOUNTER — Encounter: Payer: Self-pay | Admitting: Orthopedic Surgery

## 2020-01-28 VITALS — Ht 65.0 in | Wt 151.0 lb

## 2020-01-28 DIAGNOSIS — S838X2A Sprain of other specified parts of left knee, initial encounter: Secondary | ICD-10-CM

## 2020-01-28 MED FILL — OLOPATADINE HCL 0.1 % SOLN: 0.1 | 25 days supply | Qty: 5 | Fill #3

## 2020-01-30 ENCOUNTER — Encounter: Payer: Self-pay | Admitting: Orthopedic Surgery

## 2020-01-30 MED FILL — ZOLPIDEM TARTRATE 5 MG TAB: 5 | 30 days supply | Qty: 30 | Fill #0

## 2020-01-30 NOTE — Telephone Encounter (Signed)
Give it some more time and continue with icing/elevation. If no improvement of pain on Monday, can schedule appointment for work-in

## 2020-01-30 NOTE — Progress Notes (Signed)
   Post-Op Visit Note   Patient: Molly Vance           Date of Birth: 02-05-1961           MRN: [id number] Visit Date: 01/28/2020 PCP: Shon Baton, MD   Assessment & Plan:  Chief Complaint:  Chief Complaint  Patient presents with  . Left Knee - Follow-up   Visit Diagnoses:  1. Injury of meniscus of left knee, initial encounter     Plan: Molly Vance is a patient who is now about 2 months out left knee arthroscopy with debridement posterior medial meniscectomy. She developed DVT postoperatively. Sister has lupus anticoagulant. She has been doing 30 minutes of biking a day. On Xarelto. Also doing elliptical. On exam she has trace effusion but good quad strength and range of motion. No real calf tenderness noted today. No pain medication. Plan at this time is continue with predominantly nonweightbearing quad strengthening activities. I think her loadbearing fine activities will increase as her leg get stronger. Follow-up with me as needed.  Follow-Up Instructions: Return if symptoms worsen or fail to improve.   Orders:  No orders of the defined types were placed in this encounter.  No orders of the defined types were placed in this encounter.   Imaging: No results found.  PMFS History: Patient Active Problem List   Diagnosis Date Noted  . Menopausal hot flushes 07/12/2011  . Menopause   . Allergy   . Hyperlipidemia    Past Medical History:  Diagnosis Date  . Allergy   . Headache   . Hyperlipidemia   . Insomnia   . Menopause   . MMT (medial meniscus tear)    left  . Wears glasses     Family History  Problem Relation Age of Onset  . Diabetes Mother   . Hypertension Mother   . Diabetes Sister   . Diabetes Maternal Grandmother   . Breast cancer Sister   . Ulcerative colitis Sister   . Crohn's disease Sister   . Colon cancer Neg Hx     Past Surgical History:  Procedure Laterality Date  . BUNIONECTOMY  1984  . CESAREAN SECTION  09/23/88  . COLONOSCOPY    . KNEE  ARTHROSCOPY WITH MEDIAL MENISECTOMY Right 10/27/2014   Procedure: KNEE ARTHROSCOPY WITH PARTIAL MEDIAL MENISECTOMY, CYST DECOMPRESSION.;  Surgeon: Meredith Pel, MD;  Location: [facility];  Service: Orthopedics;  Laterality: Right;  . KNEE ARTHROSCOPY WITH MEDIAL MENISECTOMY Left 12/22/2019   Procedure: LEFT KNEE ARTHROSCOPY, DEBRIDEMENT, WITH PARTIAL MEDIAL MENISECTOMY;  Surgeon: Meredith Pel, MD;  Location: [facility];  Service: Orthopedics;  Laterality: Left;  . TONSILLECTOMY  1983   Social History   Occupational History  . Not on file  Tobacco Use  . Smoking status: Never Smoker  . Smokeless tobacco: Never Used  Substance and Sexual Activity  . Alcohol use: Yes    Alcohol/week: 6.0 standard drinks    Types: 6 Glasses of wine per week    Comment: occasional  . Drug use: No  . Sexual activity: Yes    Birth control/protection: Post-menopausal

## 2020-02-27 ENCOUNTER — Other Ambulatory Visit (HOSPITAL_COMMUNITY): Payer: Self-pay | Admitting: Ophthalmology

## 2020-02-27 MED FILL — RESTASIS 0.05% EYE EMULSION: 0.05 | 90 days supply | Qty: 180 | Fill #0

## 2020-03-23 MED FILL — ZOLPIDEM TARTRATE 5 MG TAB: 5 | 30 days supply | Qty: 30 | Fill #0

## 2020-03-23 MED FILL — OLOPATADINE HCL 0.1 % SOLN: 0.1 | 25 days supply | Qty: 5 | Fill #4

## 2020-04-06 ENCOUNTER — Ambulatory Visit (HOSPITAL_COMMUNITY)
Admission: RE | Admit: 2020-04-06 | Discharge: 2020-04-06 | Disposition: A | Discharge status: Home / Self Care | Payer: 59 | Source: Ambulatory Visit | Attending: Vascular Surgery | Admitting: Vascular Surgery

## 2020-04-06 ENCOUNTER — Other Ambulatory Visit (HOSPITAL_COMMUNITY): Payer: Self-pay | Admitting: Internal Medicine

## 2020-04-06 ENCOUNTER — Other Ambulatory Visit: Payer: Self-pay

## 2020-04-06 DIAGNOSIS — I82409 Acute embolism and thrombosis of unspecified deep veins of unspecified lower extremity: Secondary | ICD-10-CM | POA: Diagnosis not present

## 2020-04-06 DIAGNOSIS — I824Z2 Acute embolism and thrombosis of unspecified deep veins of left distal lower extremity: Secondary | ICD-10-CM | POA: Diagnosis not present

## 2020-04-06 DIAGNOSIS — Z7901 Long term (current) use of anticoagulants: Secondary | ICD-10-CM | POA: Diagnosis not present

## 2020-04-06 NOTE — Progress Notes (Signed)
Venous duplex completed. Called results to Katie at Dr. [facility] [employer] office.   June Leap, BS, RDMS, RVT

## 2020-04-20 DIAGNOSIS — I82409 Acute embolism and thrombosis of unspecified deep veins of unspecified lower extremity: Secondary | ICD-10-CM | POA: Diagnosis not present

## 2020-04-20 DIAGNOSIS — H52203 Unspecified astigmatism, bilateral: Secondary | ICD-10-CM | POA: Diagnosis not present

## 2020-04-27 MED FILL — PREMARIN VAGINAL CREAM-APPL: 0.625 | 90 days supply | Qty: 30 | Fill #0

## 2020-05-18 ENCOUNTER — Other Ambulatory Visit (HOSPITAL_COMMUNITY): Payer: Self-pay | Admitting: Internal Medicine

## 2020-05-18 MED FILL — OLOPATADINE HCL 0.1 % SOLN: 0.1 | 25 days supply | Qty: 5 | Fill #0

## 2020-05-18 MED FILL — ZOLPIDEM TARTRATE 5 MG TABS: 5 | 90 days supply | Qty: 90 | Fill #0

## 2020-07-06 ENCOUNTER — Other Ambulatory Visit: Payer: Self-pay | Admitting: Obstetrics and Gynecology

## 2020-07-06 ENCOUNTER — Other Ambulatory Visit (HOSPITAL_COMMUNITY): Payer: Self-pay | Admitting: Obstetrics and Gynecology

## 2020-07-06 DIAGNOSIS — E785 Hyperlipidemia, unspecified: Secondary | ICD-10-CM | POA: Diagnosis not present

## 2020-07-06 DIAGNOSIS — Z0000 Encounter for general adult medical examination without abnormal findings: Secondary | ICD-10-CM | POA: Diagnosis not present

## 2020-07-06 DIAGNOSIS — Z1231 Encounter for screening mammogram for malignant neoplasm of breast: Secondary | ICD-10-CM | POA: Diagnosis not present

## 2020-07-06 DIAGNOSIS — Z6826 Body mass index (BMI) 26.0-26.9, adult: Secondary | ICD-10-CM | POA: Diagnosis not present

## 2020-07-06 DIAGNOSIS — Z9189 Other specified personal risk factors, not elsewhere classified: Secondary | ICD-10-CM

## 2020-07-06 DIAGNOSIS — R739 Hyperglycemia, unspecified: Secondary | ICD-10-CM | POA: Diagnosis not present

## 2020-07-06 DIAGNOSIS — Z01419 Encounter for gynecological examination (general) (routine) without abnormal findings: Secondary | ICD-10-CM | POA: Diagnosis not present

## 2020-07-06 MED FILL — clonazePAM 0.5 MG TABS: 0.5 | 30 days supply | Qty: 30 | Fill #0

## 2020-07-13 DIAGNOSIS — M25562 Pain in left knee: Secondary | ICD-10-CM | POA: Diagnosis not present

## 2020-07-13 DIAGNOSIS — J309 Allergic rhinitis, unspecified: Secondary | ICD-10-CM | POA: Diagnosis not present

## 2020-07-13 DIAGNOSIS — R82998 Other abnormal findings in urine: Secondary | ICD-10-CM | POA: Diagnosis not present

## 2020-07-13 DIAGNOSIS — R739 Hyperglycemia, unspecified: Secondary | ICD-10-CM | POA: Diagnosis not present

## 2020-07-13 DIAGNOSIS — D239 Other benign neoplasm of skin, unspecified: Secondary | ICD-10-CM | POA: Diagnosis not present

## 2020-07-13 DIAGNOSIS — G4700 Insomnia, unspecified: Secondary | ICD-10-CM | POA: Diagnosis not present

## 2020-07-13 DIAGNOSIS — I868 Varicose veins of other specified sites: Secondary | ICD-10-CM | POA: Diagnosis not present

## 2020-07-13 DIAGNOSIS — D8989 Other specified disorders involving the immune mechanism, not elsewhere classified: Secondary | ICD-10-CM | POA: Diagnosis not present

## 2020-07-13 DIAGNOSIS — Z20828 Contact with and (suspected) exposure to other viral communicable diseases: Secondary | ICD-10-CM | POA: Diagnosis not present

## 2020-07-13 DIAGNOSIS — Z0000 Encounter for general adult medical examination without abnormal findings: Secondary | ICD-10-CM | POA: Diagnosis not present

## 2020-07-13 DIAGNOSIS — F418 Other specified anxiety disorders: Secondary | ICD-10-CM | POA: Diagnosis not present

## 2020-07-18 MED FILL — MELOXICAM 15 MG TABLET: 15 | 30 days supply | Qty: 30 | Fill #1

## 2020-07-19 ENCOUNTER — Other Ambulatory Visit (HOSPITAL_COMMUNITY): Payer: Self-pay | Admitting: Obstetrics and Gynecology

## 2020-07-19 MED FILL — PREMARIN VAGINAL CREAM-APPL: 0.625 | 90 days supply | Qty: 30 | Fill #0

## 2020-07-20 MED FILL — OLOPATADINE HCL 0.1 % SOLN: 0.1 | 25 days supply | Qty: 5 | Fill #1

## 2020-07-31 ENCOUNTER — Ambulatory Visit
Admission: RE | Admit: 2020-07-31 | Discharge: 2020-07-31 | Disposition: A | Discharge status: Home / Self Care | Payer: 59 | Source: Ambulatory Visit | Attending: Obstetrics and Gynecology | Admitting: Obstetrics and Gynecology

## 2020-07-31 DIAGNOSIS — N6489 Other specified disorders of breast: Secondary | ICD-10-CM | POA: Diagnosis not present

## 2020-07-31 DIAGNOSIS — Z9189 Other specified personal risk factors, not elsewhere classified: Secondary | ICD-10-CM

## 2020-07-31 MED ORDER — GADOBUTROL 1 MMOL/ML IV SOLN
7.0000 mL | Freq: Once | INTRAVENOUS | Status: AC | PRN
  Administered 2020-07-31: 7 mL via INTRAVENOUS

## 2020-08-09 DIAGNOSIS — Z1212 Encounter for screening for malignant neoplasm of rectum: Secondary | ICD-10-CM | POA: Diagnosis not present

## 2020-08-17 DIAGNOSIS — Z13820 Encounter for screening for osteoporosis: Secondary | ICD-10-CM | POA: Diagnosis not present

## 2020-08-24 MED FILL — RESTASIS 0.05% EYE EMULSION: 0.05 | 90 days supply | Qty: 180 | Fill #1

## 2020-09-14 MED FILL — ZOLPIDEM TARTRATE 5 MG TABS: 5 | 90 days supply | Qty: 90 | Fill #1

## 2020-12-02 ENCOUNTER — Other Ambulatory Visit (HOSPITAL_BASED_OUTPATIENT_CLINIC_OR_DEPARTMENT_OTHER): Payer: Self-pay

## 2021-01-25 ENCOUNTER — Other Ambulatory Visit (HOSPITAL_COMMUNITY): Payer: Self-pay

## 2021-01-25 DIAGNOSIS — D2262 Melanocytic nevi of left upper limb, including shoulder: Secondary | ICD-10-CM | POA: Diagnosis not present

## 2021-01-25 DIAGNOSIS — D2272 Melanocytic nevi of left lower limb, including hip: Secondary | ICD-10-CM | POA: Diagnosis not present

## 2021-01-25 DIAGNOSIS — L700 Acne vulgaris: Secondary | ICD-10-CM | POA: Diagnosis not present

## 2021-01-25 DIAGNOSIS — L814 Other melanin hyperpigmentation: Secondary | ICD-10-CM | POA: Diagnosis not present

## 2021-01-25 DIAGNOSIS — D2271 Melanocytic nevi of right lower limb, including hip: Secondary | ICD-10-CM | POA: Diagnosis not present

## 2021-01-25 DIAGNOSIS — L720 Epidermal cyst: Secondary | ICD-10-CM | POA: Diagnosis not present

## 2021-01-25 DIAGNOSIS — D225 Melanocytic nevi of trunk: Secondary | ICD-10-CM | POA: Diagnosis not present

## 2021-01-25 DIAGNOSIS — L821 Other seborrheic keratosis: Secondary | ICD-10-CM | POA: Diagnosis not present

## 2021-01-25 DIAGNOSIS — D2261 Melanocytic nevi of right upper limb, including shoulder: Secondary | ICD-10-CM | POA: Diagnosis not present

## 2021-01-25 MED ORDER — HYDROQUINONE 4 % EX CREA
TOPICAL_CREAM | Freq: Two times a day (BID) | CUTANEOUS | 1 refills | Status: AC
  Filled 2021-01-25: qty 28.35, 30d supply, fill #0

## 2021-01-25 MED ORDER — CLOBETASOL PROPIONATE 0.05 % EX SOLN
CUTANEOUS | 3 refills | Status: AC
  Filled 2021-01-25: qty 50, 30d supply, fill #0
  Filled 2021-05-24: qty 50, 50d supply, fill #0

## 2021-01-25 MED ORDER — CLINDAMYCIN PHOSPHATE 1 % EX LOTN
TOPICAL_LOTION | Freq: Two times a day (BID) | CUTANEOUS | 1 refills | Status: AC
  Filled 2021-01-25: qty 60, 30d supply, fill #0

## 2021-01-26 ENCOUNTER — Other Ambulatory Visit (HOSPITAL_COMMUNITY): Payer: Self-pay

## 2021-02-03 ENCOUNTER — Other Ambulatory Visit (HOSPITAL_COMMUNITY): Payer: Self-pay

## 2021-02-08 ENCOUNTER — Other Ambulatory Visit (HOSPITAL_COMMUNITY): Payer: Self-pay

## 2021-02-08 MED ORDER — CLONAZEPAM 0.5 MG PO TABS
ORAL_TABLET | ORAL | 3 refills | Status: DC
  Filled 2021-02-08: qty 30, 30d supply, fill #0

## 2021-02-08 MED ORDER — ZOLPIDEM TARTRATE 5 MG PO TABS
ORAL_TABLET | ORAL | 1 refills | Status: DC
  Filled 2021-02-08: qty 90, 90d supply, fill #0
  Filled 2021-05-24: qty 90, 90d supply, fill #1

## 2021-04-14 ENCOUNTER — Other Ambulatory Visit (HOSPITAL_COMMUNITY): Payer: Self-pay

## 2021-04-14 MED ORDER — AZITHROMYCIN 250 MG PO TABS
ORAL_TABLET | ORAL | 0 refills | Status: AC
  Filled 2021-04-14: qty 6, 5d supply, fill #0

## 2021-05-17 ENCOUNTER — Other Ambulatory Visit (HOSPITAL_COMMUNITY): Payer: Self-pay

## 2021-05-17 DIAGNOSIS — L661 Lichen planopilaris: Secondary | ICD-10-CM | POA: Diagnosis not present

## 2021-05-17 MED ORDER — HYDROXYCHLOROQUINE SULFATE 200 MG PO TABS
200.0000 mg | ORAL_TABLET | Freq: Two times a day (BID) | ORAL | 2 refills | Status: DC
  Filled 2021-05-17: qty 60, 30d supply, fill #0
  Filled 2021-06-22: qty 60, 30d supply, fill #1
  Filled 2021-08-01: qty 60, 30d supply, fill #2

## 2021-05-24 ENCOUNTER — Other Ambulatory Visit (HOSPITAL_COMMUNITY): Payer: Self-pay

## 2021-05-24 ENCOUNTER — Other Ambulatory Visit (HOSPITAL_BASED_OUTPATIENT_CLINIC_OR_DEPARTMENT_OTHER): Payer: Self-pay

## 2021-05-24 MED ORDER — CARESTART COVID-19 HOME TEST VI KIT
PACK | 0 refills | Status: AC
  Filled 2021-05-24: qty 4, 4d supply, fill #0

## 2021-05-25 ENCOUNTER — Other Ambulatory Visit (HOSPITAL_COMMUNITY): Payer: Self-pay

## 2021-05-25 MED ORDER — PREMARIN 0.625 MG/GM VA CREA
TOPICAL_CREAM | VAGINAL | 5 refills | Status: AC
  Filled 2021-05-25: qty 30, 90d supply, fill #0
  Filled 2021-05-25: qty 30, 30d supply, fill #0
  Filled 2022-02-07: qty 30, 90d supply, fill #1

## 2021-05-26 ENCOUNTER — Other Ambulatory Visit (HOSPITAL_COMMUNITY): Payer: Self-pay

## 2021-06-23 ENCOUNTER — Other Ambulatory Visit (HOSPITAL_COMMUNITY): Payer: Self-pay

## 2021-07-12 DIAGNOSIS — Z1231 Encounter for screening mammogram for malignant neoplasm of breast: Secondary | ICD-10-CM | POA: Diagnosis not present

## 2021-07-12 DIAGNOSIS — Z01419 Encounter for gynecological examination (general) (routine) without abnormal findings: Secondary | ICD-10-CM | POA: Diagnosis not present

## 2021-07-12 DIAGNOSIS — Z6824 Body mass index (BMI) 24.0-24.9, adult: Secondary | ICD-10-CM | POA: Diagnosis not present

## 2021-07-26 DIAGNOSIS — M859 Disorder of bone density and structure, unspecified: Secondary | ICD-10-CM | POA: Diagnosis not present

## 2021-07-26 DIAGNOSIS — R739 Hyperglycemia, unspecified: Secondary | ICD-10-CM | POA: Diagnosis not present

## 2021-07-26 DIAGNOSIS — E785 Hyperlipidemia, unspecified: Secondary | ICD-10-CM | POA: Diagnosis not present

## 2021-08-01 ENCOUNTER — Other Ambulatory Visit (HOSPITAL_COMMUNITY): Payer: Self-pay

## 2021-08-02 ENCOUNTER — Other Ambulatory Visit: Payer: Self-pay | Admitting: Internal Medicine

## 2021-08-02 ENCOUNTER — Other Ambulatory Visit (HOSPITAL_COMMUNITY): Payer: Self-pay

## 2021-08-02 DIAGNOSIS — Z0000 Encounter for general adult medical examination without abnormal findings: Secondary | ICD-10-CM | POA: Diagnosis not present

## 2021-08-02 DIAGNOSIS — R82998 Other abnormal findings in urine: Secondary | ICD-10-CM | POA: Diagnosis not present

## 2021-08-02 DIAGNOSIS — M8580 Other specified disorders of bone density and structure, unspecified site: Secondary | ICD-10-CM | POA: Diagnosis not present

## 2021-08-02 DIAGNOSIS — Z86718 Personal history of other venous thrombosis and embolism: Secondary | ICD-10-CM | POA: Diagnosis not present

## 2021-08-02 DIAGNOSIS — E785 Hyperlipidemia, unspecified: Secondary | ICD-10-CM | POA: Diagnosis not present

## 2021-08-02 DIAGNOSIS — M25562 Pain in left knee: Secondary | ICD-10-CM | POA: Diagnosis not present

## 2021-08-02 DIAGNOSIS — Z1212 Encounter for screening for malignant neoplasm of rectum: Secondary | ICD-10-CM | POA: Diagnosis not present

## 2021-08-02 DIAGNOSIS — F418 Other specified anxiety disorders: Secondary | ICD-10-CM | POA: Diagnosis not present

## 2021-08-02 DIAGNOSIS — R739 Hyperglycemia, unspecified: Secondary | ICD-10-CM | POA: Diagnosis not present

## 2021-08-02 DIAGNOSIS — R002 Palpitations: Secondary | ICD-10-CM | POA: Diagnosis not present

## 2021-08-02 DIAGNOSIS — G4700 Insomnia, unspecified: Secondary | ICD-10-CM | POA: Diagnosis not present

## 2021-08-02 MED ORDER — AZITHROMYCIN 250 MG PO TABS
ORAL_TABLET | ORAL | 0 refills | Status: AC
  Filled 2021-08-02: qty 6, 5d supply, fill #0

## 2021-08-09 ENCOUNTER — Other Ambulatory Visit (HOSPITAL_COMMUNITY): Payer: Self-pay

## 2021-08-09 MED ORDER — SODIUM FLUORIDE 1.1 % DT CREA
TOPICAL_CREAM | DENTAL | 2 refills | Status: AC
  Filled 2021-08-09: qty 102, 60d supply, fill #0
  Filled 2021-11-08: qty 102, 60d supply, fill #1
  Filled 2022-04-12: qty 102, 60d supply, fill #2

## 2021-08-10 ENCOUNTER — Other Ambulatory Visit (HOSPITAL_COMMUNITY): Payer: Self-pay

## 2021-08-30 ENCOUNTER — Other Ambulatory Visit (HOSPITAL_COMMUNITY): Payer: Self-pay

## 2021-08-31 ENCOUNTER — Other Ambulatory Visit (HOSPITAL_COMMUNITY): Payer: Self-pay

## 2021-08-31 MED ORDER — HYDROXYCHLOROQUINE SULFATE 200 MG PO TABS
200.0000 mg | ORAL_TABLET | Freq: Two times a day (BID) | ORAL | 5 refills | Status: AC
  Filled 2021-08-31: qty 60, 30d supply, fill #0
  Filled 2021-10-05: qty 60, 30d supply, fill #1
  Filled 2021-11-08: qty 60, 30d supply, fill #2
  Filled 2021-12-13: qty 60, 30d supply, fill #3
  Filled 2022-02-07: qty 60, 30d supply, fill #4
  Filled 2022-03-07: qty 60, 30d supply, fill #5

## 2021-09-13 ENCOUNTER — Ambulatory Visit
Admission: RE | Admit: 2021-09-13 | Discharge: 2021-09-13 | Disposition: A | Discharge status: Home / Self Care | Payer: No Typology Code available for payment source | Source: Ambulatory Visit | Attending: Internal Medicine | Admitting: Internal Medicine

## 2021-09-13 DIAGNOSIS — E785 Hyperlipidemia, unspecified: Secondary | ICD-10-CM

## 2021-09-14 ENCOUNTER — Other Ambulatory Visit (HOSPITAL_COMMUNITY): Payer: Self-pay

## 2021-09-14 MED ORDER — ROSUVASTATIN CALCIUM 5 MG PO TABS
ORAL_TABLET | ORAL | 3 refills | Status: DC
  Filled 2021-09-14: qty 90, 90d supply, fill #0
  Filled 2021-12-13: qty 90, 90d supply, fill #1
  Filled 2022-03-07: qty 90, 90d supply, fill #2
  Filled 2022-06-01: qty 90, 90d supply, fill #3

## 2021-10-06 ENCOUNTER — Other Ambulatory Visit (HOSPITAL_COMMUNITY): Payer: Self-pay

## 2021-10-18 ENCOUNTER — Ambulatory Visit
Admission: RE | Admit: 2021-10-18 | Discharge: 2021-10-18 | Disposition: A | Discharge status: Home / Self Care | Payer: 59 | Source: Ambulatory Visit | Attending: Sports Medicine | Admitting: Sports Medicine

## 2021-10-18 ENCOUNTER — Ambulatory Visit (INDEPENDENT_AMBULATORY_CARE_PROVIDER_SITE_OTHER): Payer: 59 | Admitting: Sports Medicine

## 2021-10-18 VITALS — BP 140/78 | Ht 65.0 in | Wt 148.0 lb

## 2021-10-18 DIAGNOSIS — M19071 Primary osteoarthritis, right ankle and foot: Secondary | ICD-10-CM | POA: Diagnosis not present

## 2021-10-18 DIAGNOSIS — M79671 Pain in right foot: Secondary | ICD-10-CM

## 2021-10-18 NOTE — Progress Notes (Addendum)
° °  Subjective:    Patient ID: Molly Vance, female    DOB: 1960-09-19, 61 y.o.   MRN: [id number]  HPI chief complaint: Right foot pain  Very pleasant 61 year old female comes in today complaining of approximately 3 weeks of right foot pain.  Her pain began after she lost her balance while climbing on a small ladder in her closet.  She initially noticed some swelling on the dorsum of her foot but that has improved.  She has continued to have pain both on the dorsum of the foot as well as the plantar aspect of the foot which is most noticeable after walking.  She localizes her pain around the distal second and third metatarsals.  She denies pain elsewhere in the foot.  She has also had a chronic splaying of her first and second toes after bunion surgery many years ago.  She has now noticed that that splaying is less since her injury.  She denies any limping.  No pain in the ankle.  Past medical history reviewed Medications reviewed Allergies reviewed    Review of Systems As above    Objective:   Physical Exam  Well-developed, well-nourished.  No acute distress  Right foot: There is minimal swelling over the dorsal aspect of the distal second metatarsal when compared to the uninvolved left foot.  Slight tenderness to palpation in this area.  Slight tenderness to palpation along the plantar area at the metatarsal head as well.  No significant pain with metatarsal squeeze.  Full ankle range of motion.  Good pulses.  Ambulating without significant limp.      Assessment & Plan:   Dorsal right foot pain status post injury  X-ray of the right foot including AP, lateral, and oblique views to rule out fracture.  If unremarkable, patient Molly Vance will use a simple metatarsal pad in her right shoe until her pain improves.  We will schedule a follow-up visit with me in 3 weeks but I will call her with x-ray results once available.  This note was dictated using Dragon naturally speaking software and  may contain errors in syntax, spelling, or content which have not been identified prior to signing this note.   Addendum: X-ray reviewed.  No obvious fracture seen.

## 2021-11-08 ENCOUNTER — Ambulatory Visit (INDEPENDENT_AMBULATORY_CARE_PROVIDER_SITE_OTHER): Payer: 59 | Admitting: Sports Medicine

## 2021-11-08 ENCOUNTER — Other Ambulatory Visit (HOSPITAL_COMMUNITY): Payer: Self-pay

## 2021-11-08 VITALS — BP 140/90 | Ht 65.5 in | Wt 150.0 lb

## 2021-11-08 DIAGNOSIS — M79671 Pain in right foot: Secondary | ICD-10-CM

## 2021-11-08 NOTE — Progress Notes (Signed)
° °  Subjective:    Patient ID: Molly Vance, female    DOB: [birth year], 61 y.o.   MRN: [id number]  HPI  Molly Vance presents today for follow-up on dorsal right foot pain.  Her pain has improved some but not resolved.  She still endorses pain at the distal second metatarsal with recreational walking.  She does also endorse intermittent swelling.  She denies any limping.  X-rays of her foot recently showed no obvious fracture.  She did not like the metatarsal pad so she removed it after 2 days of use.    Review of Systems As above    Objective:   Physical Exam  Well-developed, well-nourished.  No acute distress  Right foot: There is still some tenderness to palpation dorsally at the distal second metatarsal.  No obvious soft tissue swelling.  There is no tenderness to palpation at the metatarsal head.  Good pulses.  Walking without a limp.      Assessment & Plan:   Right foot pain likely secondary to distal second metatarsal stress reaction  Although her x-ray was unremarkable clinically I think Molly Vance has a metatarsal stress reaction.  She is about 60% improved since the time of her injury and she denies limping currently.  I do not think we need to immobilize this but I want her to avoid any recreational walking or workouts on the elliptical until her pain resolves.  I would think that over the next 4 weeks her symptoms should improve to the point that she is able to return to her previous level activity.  If that is not the case, she will call me and I will consider merits of further diagnostic imaging.  Follow-up for ongoing or recalcitrant issues.  This note was dictated using Dragon naturally speaking software and may contain errors in syntax, spelling, or content which have not been identified prior to signing this note.

## 2021-11-09 ENCOUNTER — Other Ambulatory Visit (HOSPITAL_COMMUNITY): Payer: Self-pay

## 2021-11-09 MED ORDER — CARESTART COVID-19 HOME TEST VI KIT
PACK | 0 refills | Status: AC
  Filled 2021-11-09: qty 4, 4d supply, fill #0

## 2021-11-10 ENCOUNTER — Other Ambulatory Visit (HOSPITAL_COMMUNITY): Payer: Self-pay

## 2021-11-10 MED ORDER — ZOLPIDEM TARTRATE 5 MG PO TABS
5.0000 mg | ORAL_TABLET | Freq: Every evening | ORAL | 1 refills | Status: AC | PRN
  Filled 2021-11-10: qty 90, 90d supply, fill #0
  Filled 2022-04-12: qty 90, 90d supply, fill #1

## 2021-11-11 ENCOUNTER — Other Ambulatory Visit (HOSPITAL_COMMUNITY): Payer: Self-pay

## 2021-11-11 MED ORDER — CLONAZEPAM 0.5 MG PO TABS
0.5000 mg | ORAL_TABLET | Freq: Every evening | ORAL | 0 refills | Status: DC | PRN
  Filled 2021-11-11: qty 30, 30d supply, fill #0

## 2021-11-15 ENCOUNTER — Other Ambulatory Visit (HOSPITAL_COMMUNITY): Payer: Self-pay

## 2021-11-15 DIAGNOSIS — L821 Other seborrheic keratosis: Secondary | ICD-10-CM | POA: Diagnosis not present

## 2021-11-15 DIAGNOSIS — L661 Lichen planopilaris: Secondary | ICD-10-CM | POA: Diagnosis not present

## 2021-11-15 MED ORDER — HYDROXYCHLOROQUINE SULFATE 200 MG PO TABS
200.0000 mg | ORAL_TABLET | Freq: Two times a day (BID) | ORAL | 5 refills | Status: DC
  Filled 2021-11-15 – 2022-04-14 (×2): qty 60, 30d supply, fill #0
  Filled 2022-06-28: qty 60, 30d supply, fill #1
  Filled 2022-08-29: qty 60, 30d supply, fill #2
  Filled 2022-10-31: qty 60, 30d supply, fill #3

## 2021-11-15 MED ORDER — CLOBETASOL PROPIONATE 0.05 % EX SOLN
CUTANEOUS | 5 refills | Status: DC
  Filled 2021-11-15: qty 50, 30d supply, fill #0

## 2021-12-13 ENCOUNTER — Other Ambulatory Visit (HOSPITAL_COMMUNITY): Payer: Self-pay

## 2021-12-13 DIAGNOSIS — Z79899 Other long term (current) drug therapy: Secondary | ICD-10-CM | POA: Diagnosis not present

## 2021-12-13 DIAGNOSIS — H52203 Unspecified astigmatism, bilateral: Secondary | ICD-10-CM | POA: Diagnosis not present

## 2022-01-24 DIAGNOSIS — L661 Lichen planopilaris: Secondary | ICD-10-CM | POA: Diagnosis not present

## 2022-01-24 DIAGNOSIS — D2261 Melanocytic nevi of right upper limb, including shoulder: Secondary | ICD-10-CM | POA: Diagnosis not present

## 2022-01-24 DIAGNOSIS — D225 Melanocytic nevi of trunk: Secondary | ICD-10-CM | POA: Diagnosis not present

## 2022-01-24 DIAGNOSIS — L720 Epidermal cyst: Secondary | ICD-10-CM | POA: Diagnosis not present

## 2022-01-24 DIAGNOSIS — D2271 Melanocytic nevi of right lower limb, including hip: Secondary | ICD-10-CM | POA: Diagnosis not present

## 2022-01-24 DIAGNOSIS — L821 Other seborrheic keratosis: Secondary | ICD-10-CM | POA: Diagnosis not present

## 2022-01-24 DIAGNOSIS — L7211 Pilar cyst: Secondary | ICD-10-CM | POA: Diagnosis not present

## 2022-02-07 ENCOUNTER — Other Ambulatory Visit (HOSPITAL_COMMUNITY): Payer: Self-pay

## 2022-02-08 ENCOUNTER — Other Ambulatory Visit (HOSPITAL_COMMUNITY): Payer: Self-pay

## 2022-02-09 ENCOUNTER — Other Ambulatory Visit (HOSPITAL_COMMUNITY): Payer: Self-pay

## 2022-02-10 IMAGING — CT CT CARDIAC CORONARY ARTERY CALCIUM SCORE
3 series · 14 of 20 positions shown, 16 images · non-contrast
Comparison: None.

CLINICAL DATA: 52-year-old Caucasian female with history of
hyperlipidemia.

EXAM:
CT CARDIAC CORONARY ARTERY CALCIUM SCORE
TECHNIQUE: Non-contrast imaging through the heart was performed using
prospective ECG gating. Image post processing was performed on an
independent workstation, allowing for quantitative analysis of the
heart and coronary arteries. Note that this exam targets the heart
and the chest was not imaged in its entirety.

[Series 2: calcium scoring 2.00 qr36 bestdiast 70% hrt calciu · axial · 0.36mm/px · z∈[+1657,+1765]mm · 4 of 90 slices shown]
[im 18/90  vessel]
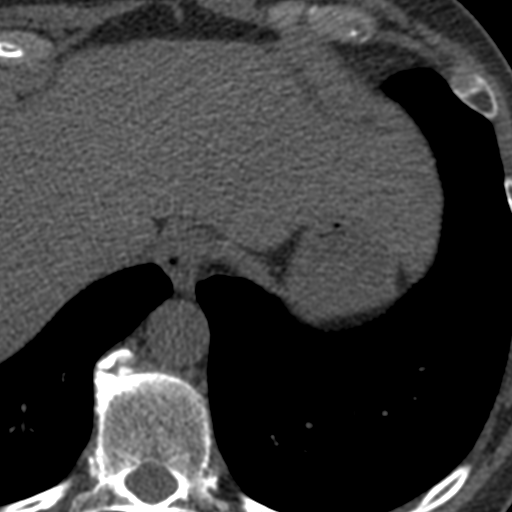
[im 36/90  vessel]
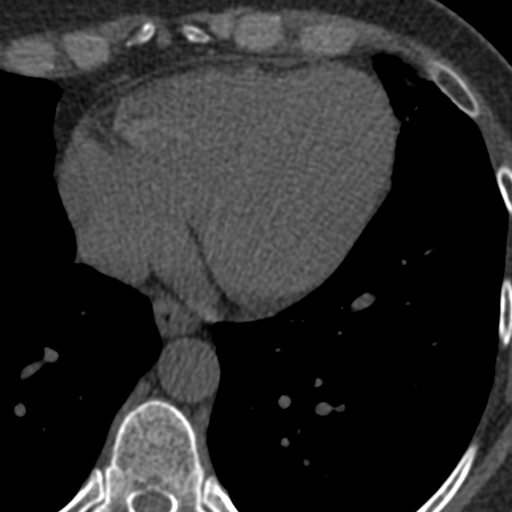
[im 54/90  vessel]
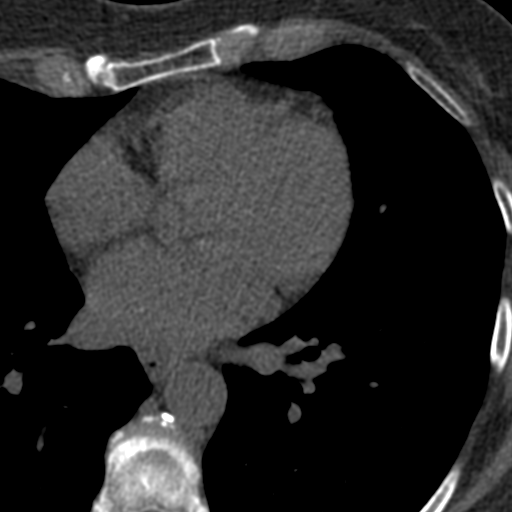
[im 72/90  vessel]
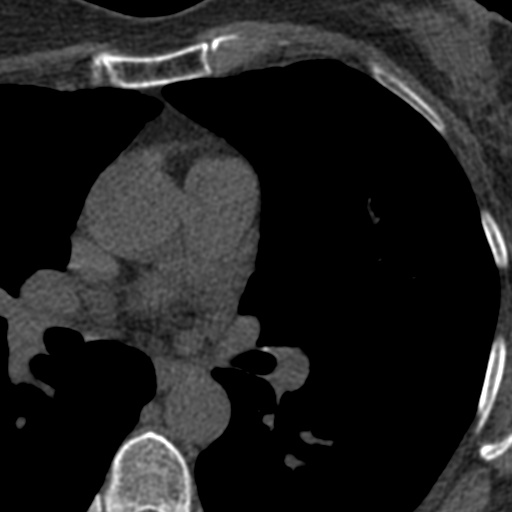

[Series 3: calcium scoring 2.00 br40 bestdiast 70% axial · axial · 0.61mm/px · z∈[+1651,+1771]mm · 5 of 90 slices shown, 7 images]
[im 15/90  vessel]
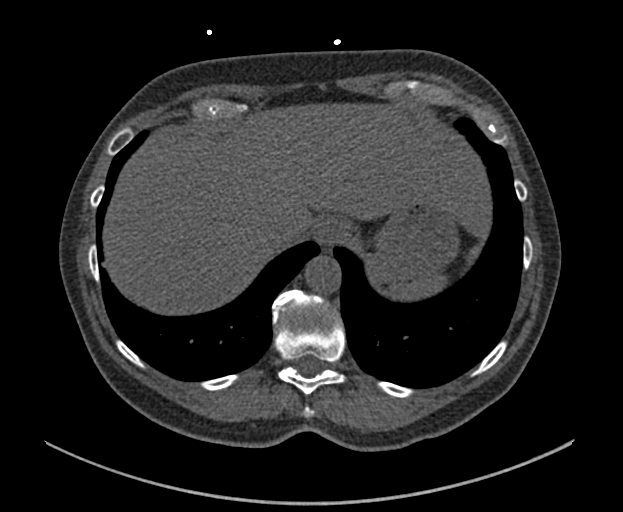
[im 15/90  lung]
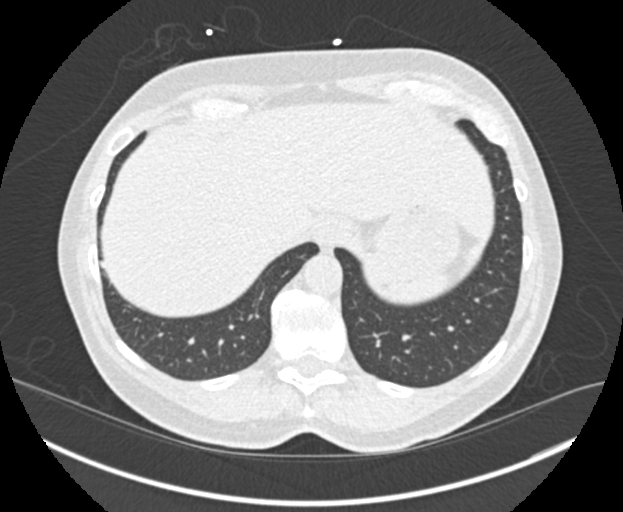
[im 30/90  vessel]
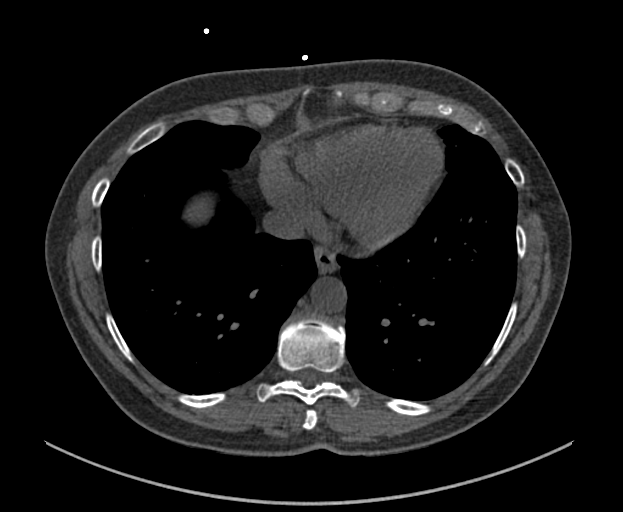
[im 45/90  vessel]
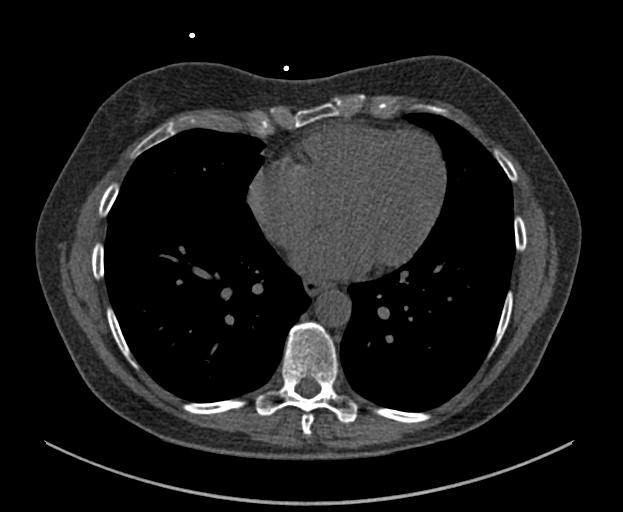
[im 60/90  vessel]
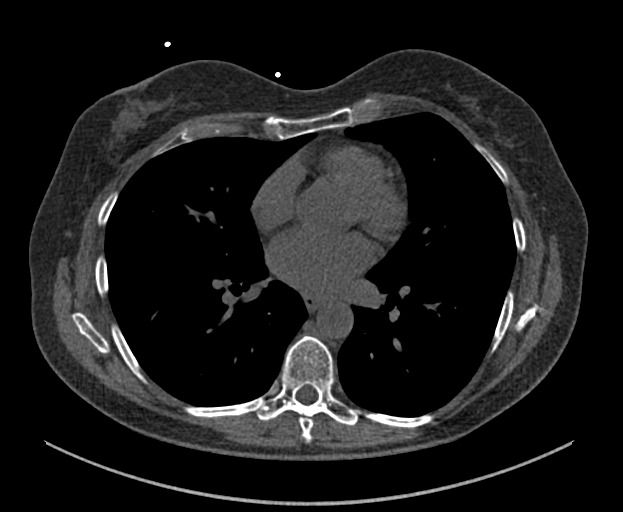
[im 75/90  vessel]
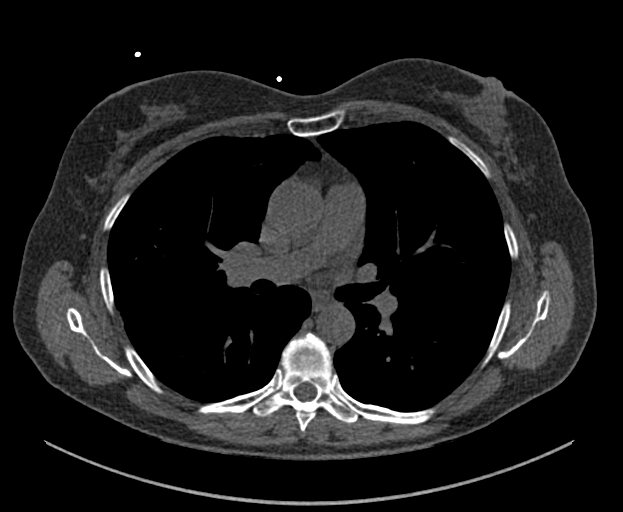
[im 75/90  lung]
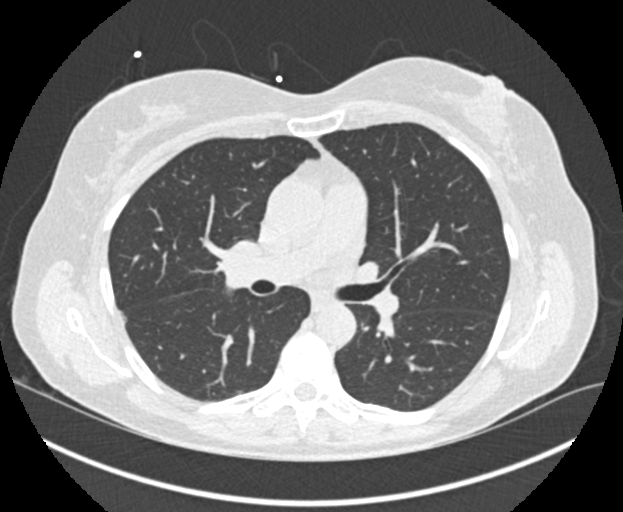

[Series 9: calcium scoring 2.00 br60 bestdiast 70% lungs · axial · 0.61mm/px · z∈[+1651,+1771]mm · 5 of 90 slices shown]
[im 15/90  vessel]
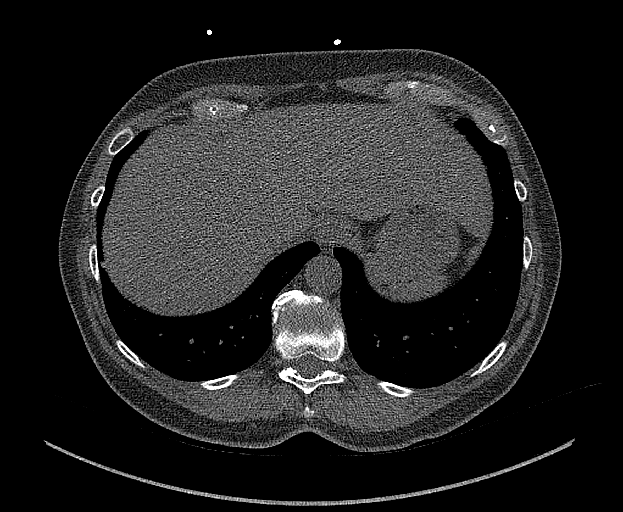
[im 30/90  vessel]
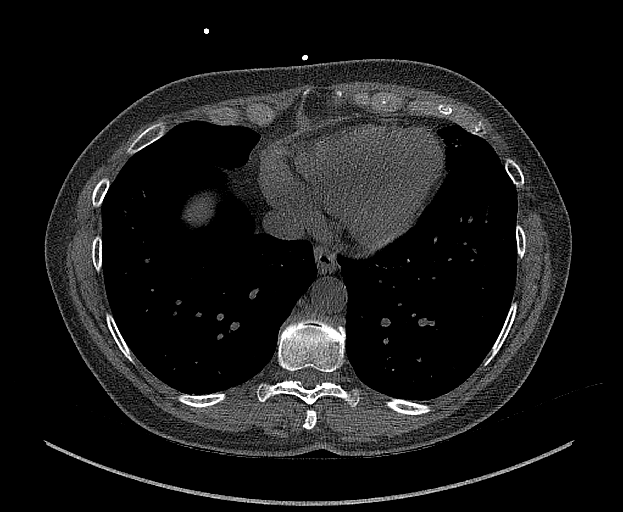
[im 45/90  vessel]
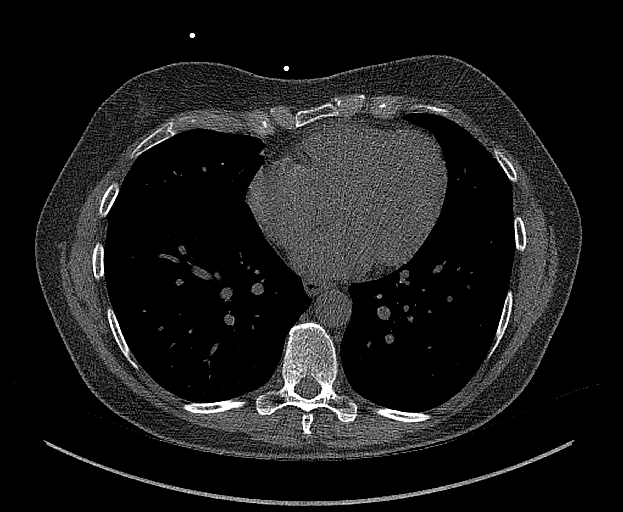
[im 60/90  vessel]
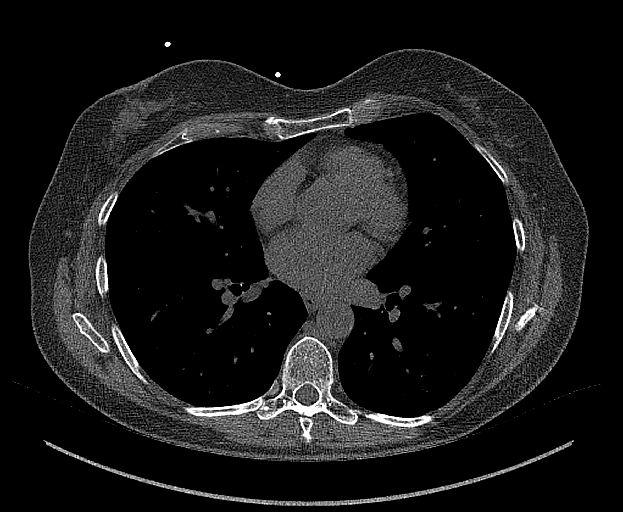
[im 75/90  vessel]
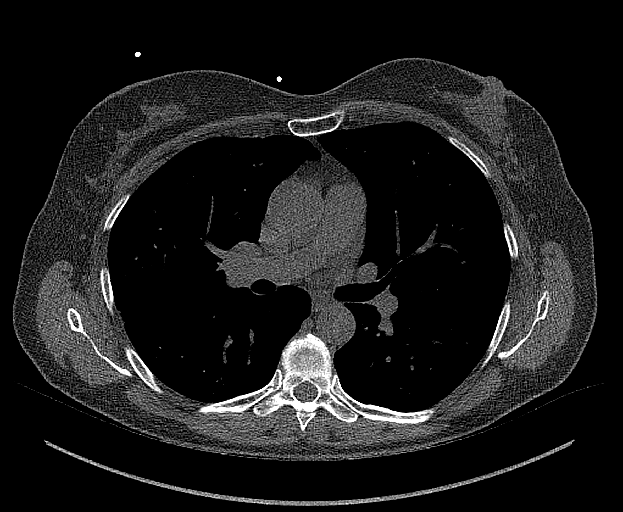

[14 of 20 positions shown; findings below may reference images not displayed]

FINDINGS: CORONARY CALCIUM SCORES:

Left Main: 0

LAD:

LCx: 0

RCA: 0

Total Agatston Score:

[HOSPITAL] percentile: 80

AORTA MEASUREMENTS:

Ascending Aorta: 31 mm

Descending Aorta: 23 mm

OTHER FINDINGS:

The heart size is within normal limits. No pericardial fluid is
identified. Visualized segments of the thoracic aorta and central
pulmonary arteries are normal in caliber. Visualized mediastinum and
hilar regions demonstrate no lymphadenopathy or masses. Parenchymal
scarring with associated 3 mm nodule in the inferior lingula. 2.5 mm
nodule at the lateral left lung base within the left lower lobe.
Visualized lungs show no evidence of pulmonary edema, consolidation,
pneumothorax or pleural fluid. Visualized upper abdomen and bony
structures are unremarkable.
IMPRESSION: 1. Coronary calcium score of 18.6 is at the 80th percentile for the
patient's age, sex and race.
2. 3 mm inferior lingular and 2.5 mm left basilar nodules. The
lingular nodules associated with scarring.
4 mm right solid pulmonary nodule. No routine follow-up imaging is
recommended per [HOSPITAL] Guidelines.
These guidelines do not apply to immunocompromised patients and
patients with cancer. Follow up in patients with significant
comorbidities as clinically warranted. For lung cancer screening,
adhere to Lung-RADS guidelines. Reference: Radiology. 4290;

## 2022-03-07 ENCOUNTER — Other Ambulatory Visit (HOSPITAL_COMMUNITY): Payer: Self-pay

## 2022-03-17 IMAGING — CR DG FOOT COMPLETE 3+V*R*
3 series · 3 of 3 positions shown · non-contrast
Comparison: No recent prior.

CLINICAL DATA: History of right foot/right second digit pain. Fall
1 month ago.

EXAM:
RIGHT FOOT COMPLETE - 3+ VIEW

[t foot ap right]
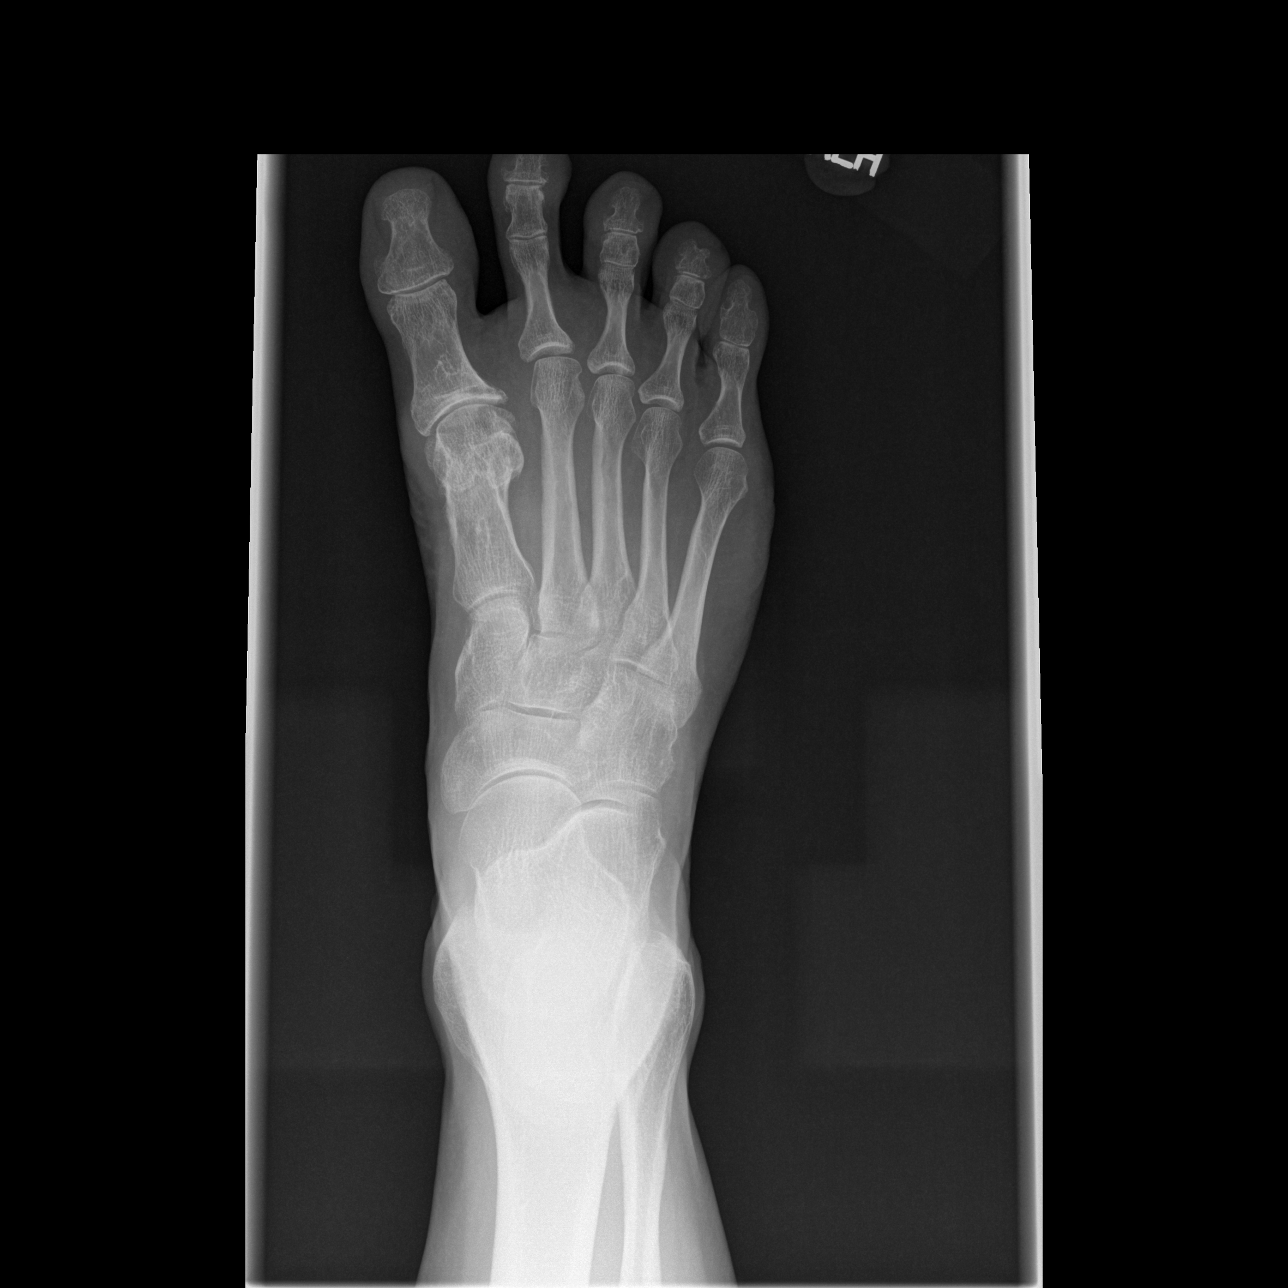

[t foot oblique right]
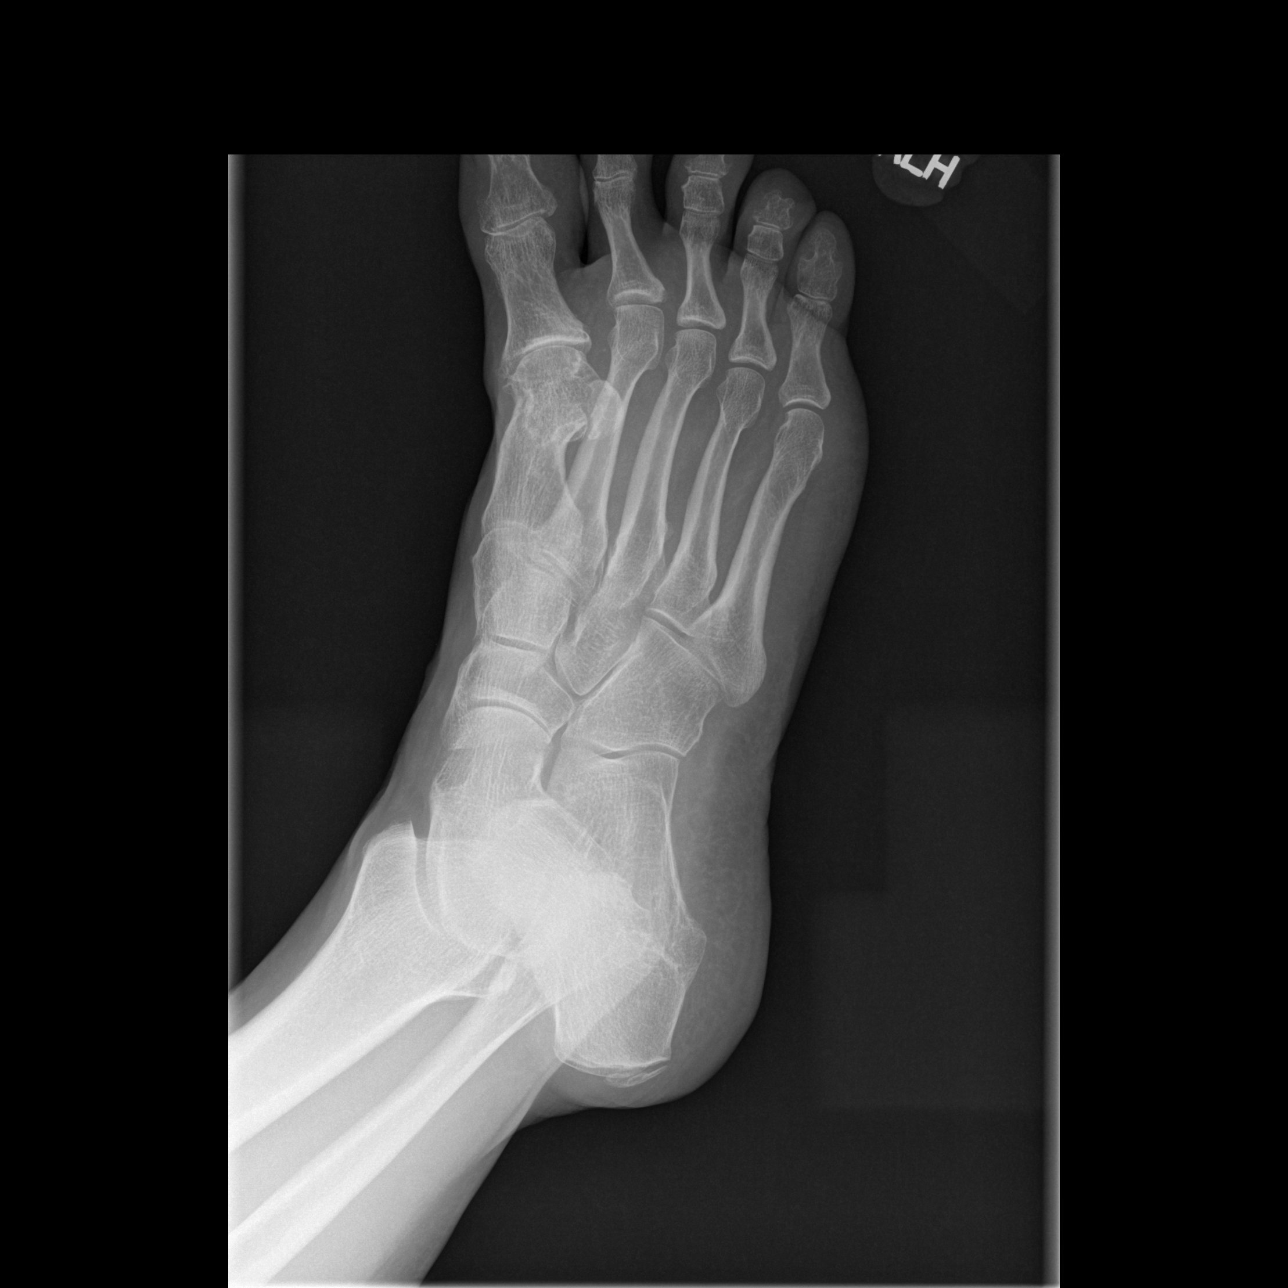

[t foot lat right]
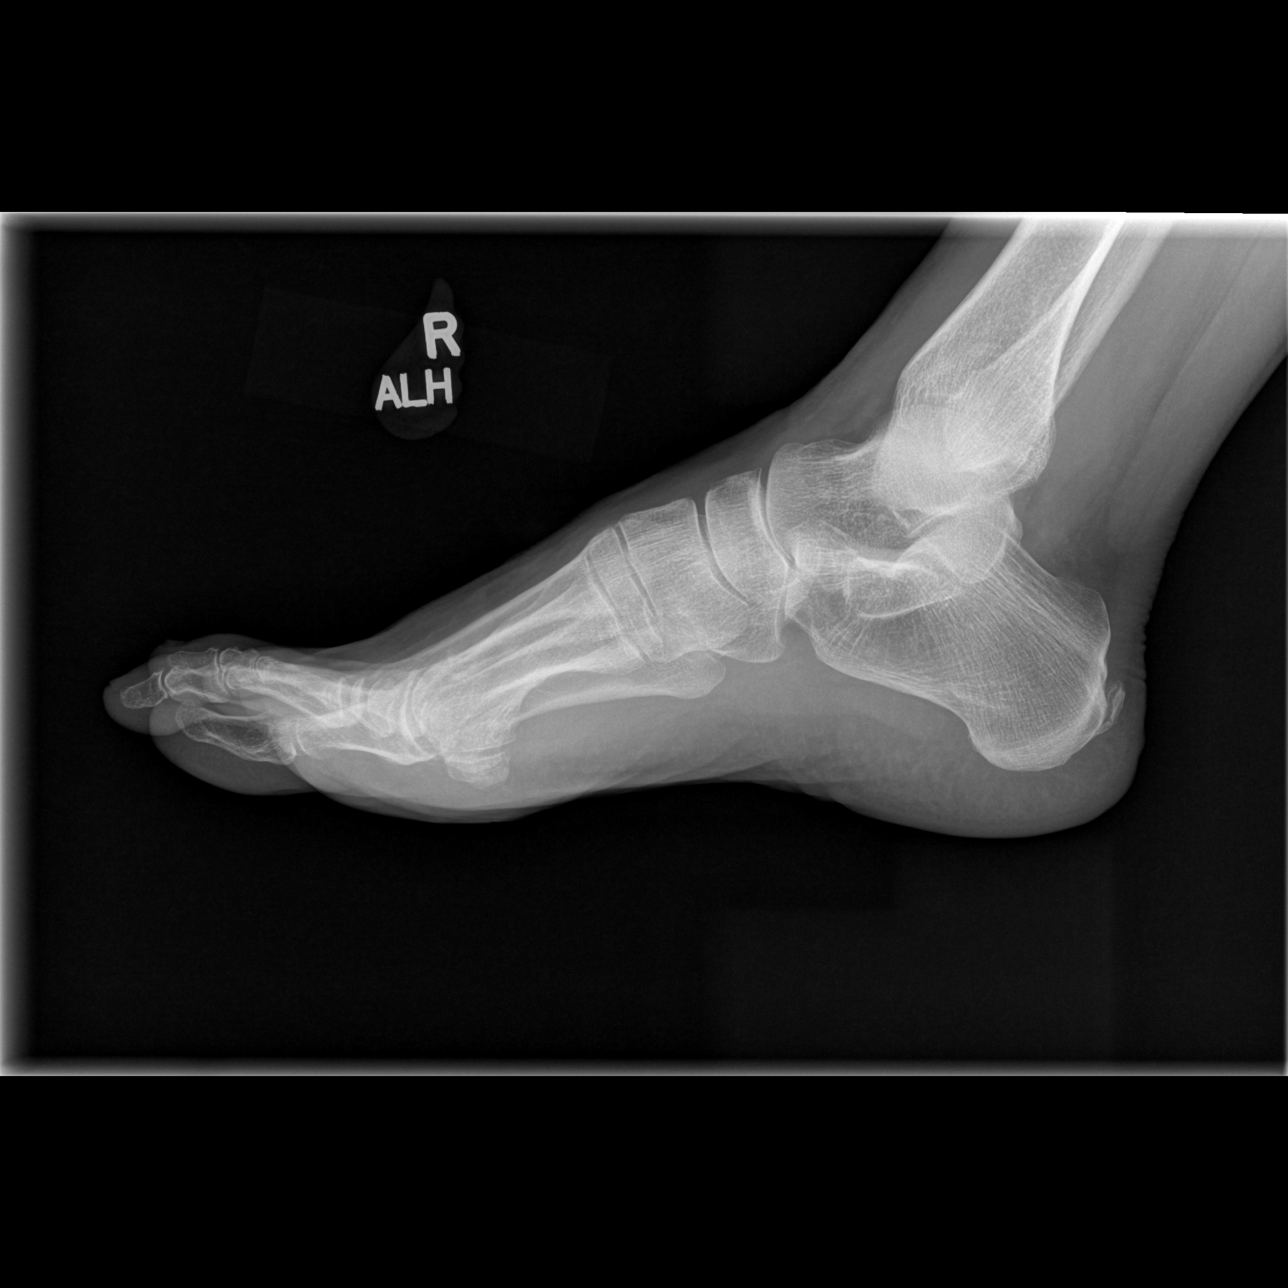

[3 of 3 positions shown; findings below may reference images not displayed]

FINDINGS: Diffuse degenerative change. Degenerative changes most prominent
about the first metatarsophalangeal joint. Slight deformity of the
right first metatarsal and distal aspect of the middle phalanx of
the right second digit noted. This may be related to old injury. No
acute fracture or dislocation noted. No radiopaque foreign body.
IMPRESSION: Diffuse degenerative change. Degenerative changes most prior about
the first metatarsophalangeal joint. Slight deformity noted of the
right first metatarsal and distal aspect of the middle phalanx of
the right second digit. This may be related to old injury. No acute
abnormality identified.

## 2022-04-12 ENCOUNTER — Other Ambulatory Visit (HOSPITAL_COMMUNITY): Payer: Self-pay

## 2022-04-13 ENCOUNTER — Other Ambulatory Visit (HOSPITAL_COMMUNITY): Payer: Self-pay

## 2022-04-14 ENCOUNTER — Other Ambulatory Visit (HOSPITAL_COMMUNITY): Payer: Self-pay

## 2022-06-02 ENCOUNTER — Other Ambulatory Visit (HOSPITAL_COMMUNITY): Payer: Self-pay

## 2022-06-26 ENCOUNTER — Other Ambulatory Visit (HOSPITAL_COMMUNITY): Payer: Self-pay

## 2022-06-26 DIAGNOSIS — L661 Lichen planopilaris: Secondary | ICD-10-CM | POA: Diagnosis not present

## 2022-06-26 DIAGNOSIS — L570 Actinic keratosis: Secondary | ICD-10-CM | POA: Diagnosis not present

## 2022-06-26 MED ORDER — FLUOROURACIL 5 % EX CREA
TOPICAL_CREAM | CUTANEOUS | 0 refills | Status: AC
Start: 2022-06-26 — End: ?
  Filled 2022-06-26: qty 40, 14d supply, fill #0

## 2022-06-29 ENCOUNTER — Other Ambulatory Visit (HOSPITAL_COMMUNITY): Payer: Self-pay

## 2022-08-08 DIAGNOSIS — R739 Hyperglycemia, unspecified: Secondary | ICD-10-CM | POA: Diagnosis not present

## 2022-08-08 DIAGNOSIS — E785 Hyperlipidemia, unspecified: Secondary | ICD-10-CM | POA: Diagnosis not present

## 2022-08-08 DIAGNOSIS — M8580 Other specified disorders of bone density and structure, unspecified site: Secondary | ICD-10-CM | POA: Diagnosis not present

## 2022-08-08 DIAGNOSIS — Z1211 Encounter for screening for malignant neoplasm of colon: Secondary | ICD-10-CM | POA: Diagnosis not present

## 2022-08-15 DIAGNOSIS — Z86718 Personal history of other venous thrombosis and embolism: Secondary | ICD-10-CM | POA: Diagnosis not present

## 2022-08-15 DIAGNOSIS — Z0000 Encounter for general adult medical examination without abnormal findings: Secondary | ICD-10-CM | POA: Diagnosis not present

## 2022-08-15 DIAGNOSIS — D8989 Other specified disorders involving the immune mechanism, not elsewhere classified: Secondary | ICD-10-CM | POA: Diagnosis not present

## 2022-08-15 DIAGNOSIS — E785 Hyperlipidemia, unspecified: Secondary | ICD-10-CM | POA: Diagnosis not present

## 2022-08-15 DIAGNOSIS — R82998 Other abnormal findings in urine: Secondary | ICD-10-CM | POA: Diagnosis not present

## 2022-08-15 DIAGNOSIS — F418 Other specified anxiety disorders: Secondary | ICD-10-CM | POA: Diagnosis not present

## 2022-08-15 DIAGNOSIS — G4700 Insomnia, unspecified: Secondary | ICD-10-CM | POA: Diagnosis not present

## 2022-08-15 DIAGNOSIS — J309 Allergic rhinitis, unspecified: Secondary | ICD-10-CM | POA: Diagnosis not present

## 2022-08-15 DIAGNOSIS — D239 Other benign neoplasm of skin, unspecified: Secondary | ICD-10-CM | POA: Diagnosis not present

## 2022-08-15 DIAGNOSIS — M8580 Other specified disorders of bone density and structure, unspecified site: Secondary | ICD-10-CM | POA: Diagnosis not present

## 2022-08-18 ENCOUNTER — Other Ambulatory Visit: Payer: Self-pay | Admitting: Internal Medicine

## 2022-08-18 DIAGNOSIS — R911 Solitary pulmonary nodule: Secondary | ICD-10-CM

## 2022-08-28 ENCOUNTER — Other Ambulatory Visit: Payer: Self-pay

## 2022-08-28 ENCOUNTER — Other Ambulatory Visit (HOSPITAL_COMMUNITY): Payer: Self-pay

## 2022-08-28 MED ORDER — BELSOMRA 10 MG PO TABS
1.0000 | ORAL_TABLET | Freq: Every evening | ORAL | 1 refills | Status: AC | PRN
  Filled 2022-08-28 – 2023-02-19 (×2): qty 30, 30d supply, fill #0

## 2022-08-29 ENCOUNTER — Other Ambulatory Visit (HOSPITAL_COMMUNITY): Payer: Self-pay

## 2022-08-30 ENCOUNTER — Other Ambulatory Visit (HOSPITAL_COMMUNITY): Payer: Self-pay

## 2022-08-30 ENCOUNTER — Other Ambulatory Visit: Payer: Self-pay

## 2022-08-30 MED ORDER — ROSUVASTATIN CALCIUM 5 MG PO TABS
5.0000 mg | ORAL_TABLET | Freq: Every day | ORAL | 3 refills | Status: DC
  Filled 2022-08-30: qty 90, 90d supply, fill #0
  Filled 2022-11-21: qty 90, 90d supply, fill #1
  Filled 2023-02-19: qty 90, 90d supply, fill #2
  Filled 2023-04-30: qty 90, 90d supply, fill #3

## 2022-08-31 ENCOUNTER — Other Ambulatory Visit: Payer: Self-pay

## 2022-08-31 ENCOUNTER — Other Ambulatory Visit (HOSPITAL_COMMUNITY): Payer: Self-pay

## 2022-08-31 MED ORDER — ZOLPIDEM TARTRATE 5 MG PO TABS
5.0000 mg | ORAL_TABLET | Freq: Every evening | ORAL | 2 refills | Status: AC | PRN
  Filled 2022-08-31: qty 90, 90d supply, fill #0
  Filled 2023-01-28: qty 90, 90d supply, fill #1

## 2022-09-13 ENCOUNTER — Other Ambulatory Visit (HOSPITAL_COMMUNITY): Payer: Self-pay

## 2022-09-13 MED ORDER — ERYTHROMYCIN 5 MG/GM OP OINT
TOPICAL_OINTMENT | OPHTHALMIC | 0 refills | Status: AC
  Filled 2022-09-13: qty 3.5, 7d supply, fill #0

## 2022-09-13 MED ORDER — AZITHROMYCIN 250 MG PO TABS
ORAL_TABLET | ORAL | 0 refills | Status: AC
  Filled 2022-09-13: qty 6, 5d supply, fill #0

## 2022-09-19 ENCOUNTER — Other Ambulatory Visit (HOSPITAL_COMMUNITY): Payer: Self-pay

## 2022-09-19 MED ORDER — CYCLOSPORINE 0.05 % OP EMUL
1.0000 [drp] | Freq: Two times a day (BID) | OPHTHALMIC | 3 refills | Status: AC
  Filled 2022-09-19: qty 60, 30d supply, fill #0
  Filled 2022-09-19: qty 180, 90d supply, fill #0
  Filled 2022-09-23 – 2022-10-09 (×3): qty 60, 30d supply, fill #0
  Filled 2023-02-19: qty 60, 30d supply, fill #1
  Filled 2023-06-26: qty 60, 30d supply, fill #2
  Filled 2023-08-14: qty 180, 90d supply, fill #3

## 2022-09-23 ENCOUNTER — Other Ambulatory Visit: Payer: Self-pay

## 2022-09-23 ENCOUNTER — Other Ambulatory Visit (HOSPITAL_BASED_OUTPATIENT_CLINIC_OR_DEPARTMENT_OTHER): Payer: Self-pay

## 2022-09-26 ENCOUNTER — Ambulatory Visit
Admission: RE | Admit: 2022-09-26 | Discharge: 2022-09-26 | Disposition: A | Discharge status: Home / Self Care | Payer: 59 | Source: Ambulatory Visit | Attending: Internal Medicine | Admitting: Internal Medicine

## 2022-09-26 DIAGNOSIS — H524 Presbyopia: Secondary | ICD-10-CM | POA: Diagnosis not present

## 2022-09-26 DIAGNOSIS — R911 Solitary pulmonary nodule: Secondary | ICD-10-CM | POA: Diagnosis not present

## 2022-09-26 DIAGNOSIS — R918 Other nonspecific abnormal finding of lung field: Secondary | ICD-10-CM | POA: Diagnosis not present

## 2022-09-26 DIAGNOSIS — H532 Diplopia: Secondary | ICD-10-CM | POA: Diagnosis not present

## 2022-09-27 ENCOUNTER — Other Ambulatory Visit: Payer: Self-pay | Admitting: Ophthalmology

## 2022-09-27 DIAGNOSIS — H532 Diplopia: Secondary | ICD-10-CM

## 2022-09-28 ENCOUNTER — Other Ambulatory Visit: Payer: Self-pay | Admitting: Ophthalmology

## 2022-09-28 ENCOUNTER — Encounter: Payer: Self-pay | Admitting: Ophthalmology

## 2022-09-28 DIAGNOSIS — H532 Diplopia: Secondary | ICD-10-CM

## 2022-09-29 ENCOUNTER — Other Ambulatory Visit (HOSPITAL_COMMUNITY): Payer: Self-pay

## 2022-09-29 ENCOUNTER — Other Ambulatory Visit (HOSPITAL_BASED_OUTPATIENT_CLINIC_OR_DEPARTMENT_OTHER): Payer: Self-pay

## 2022-10-03 ENCOUNTER — Encounter (HOSPITAL_COMMUNITY): Payer: Self-pay

## 2022-10-03 ENCOUNTER — Other Ambulatory Visit (HOSPITAL_COMMUNITY): Payer: Self-pay

## 2022-10-04 ENCOUNTER — Other Ambulatory Visit: Payer: Self-pay

## 2022-10-09 ENCOUNTER — Other Ambulatory Visit: Payer: Self-pay

## 2022-10-09 ENCOUNTER — Other Ambulatory Visit (HOSPITAL_COMMUNITY): Payer: Self-pay

## 2022-10-10 ENCOUNTER — Other Ambulatory Visit (HOSPITAL_COMMUNITY): Payer: Self-pay

## 2022-10-10 ENCOUNTER — Other Ambulatory Visit: Payer: Self-pay

## 2022-10-10 MED ORDER — TRIAZOLAM 0.125 MG PO TABS
0.3750 mg | ORAL_TABLET | ORAL | 0 refills | Status: AC
  Filled 2022-10-10: qty 3, 1d supply, fill #0

## 2022-10-11 ENCOUNTER — Other Ambulatory Visit (HOSPITAL_COMMUNITY): Payer: Self-pay

## 2022-10-14 ENCOUNTER — Ambulatory Visit
Admission: RE | Admit: 2022-10-14 | Discharge: 2022-10-14 | Disposition: A | Discharge status: Home / Self Care | Payer: 59 | Source: Ambulatory Visit | Attending: Ophthalmology | Admitting: Ophthalmology

## 2022-10-14 DIAGNOSIS — H532 Diplopia: Secondary | ICD-10-CM

## 2022-10-14 DIAGNOSIS — Z0389 Encounter for observation for other suspected diseases and conditions ruled out: Secondary | ICD-10-CM | POA: Diagnosis not present

## 2022-10-14 MED ORDER — GADOPICLENOL 0.5 MMOL/ML IV SOLN
7.0000 mL | Freq: Once | INTRAVENOUS | Status: AC | PRN
  Administered 2022-10-14: 7 mL via INTRAVENOUS

## 2022-10-31 ENCOUNTER — Other Ambulatory Visit (HOSPITAL_COMMUNITY): Payer: Self-pay

## 2022-11-20 DIAGNOSIS — Z01419 Encounter for gynecological examination (general) (routine) without abnormal findings: Secondary | ICD-10-CM | POA: Diagnosis not present

## 2022-11-20 DIAGNOSIS — Z6826 Body mass index (BMI) 26.0-26.9, adult: Secondary | ICD-10-CM | POA: Diagnosis not present

## 2022-11-20 DIAGNOSIS — Z1231 Encounter for screening mammogram for malignant neoplasm of breast: Secondary | ICD-10-CM | POA: Diagnosis not present

## 2022-11-21 ENCOUNTER — Other Ambulatory Visit: Payer: Self-pay | Admitting: Obstetrics and Gynecology

## 2022-11-21 DIAGNOSIS — Z803 Family history of malignant neoplasm of breast: Secondary | ICD-10-CM

## 2022-11-22 ENCOUNTER — Other Ambulatory Visit (HOSPITAL_COMMUNITY): Payer: Self-pay

## 2022-11-22 DIAGNOSIS — H532 Diplopia: Secondary | ICD-10-CM | POA: Diagnosis not present

## 2022-11-22 DIAGNOSIS — H5051 Esophoria: Secondary | ICD-10-CM | POA: Diagnosis not present

## 2022-11-22 DIAGNOSIS — H5021 Vertical strabismus, right eye: Secondary | ICD-10-CM | POA: Diagnosis not present

## 2022-11-22 DIAGNOSIS — Z9889 Other specified postprocedural states: Secondary | ICD-10-CM | POA: Diagnosis not present

## 2022-12-12 ENCOUNTER — Other Ambulatory Visit (HOSPITAL_COMMUNITY): Payer: Self-pay

## 2022-12-12 DIAGNOSIS — D485 Neoplasm of uncertain behavior of skin: Secondary | ICD-10-CM | POA: Diagnosis not present

## 2022-12-12 MED ORDER — CLONAZEPAM 0.5 MG PO TABS
0.5000 mg | ORAL_TABLET | Freq: Every evening | ORAL | 3 refills | Status: DC | PRN
  Filled 2022-12-12: qty 30, 30d supply, fill #0

## 2022-12-13 ENCOUNTER — Other Ambulatory Visit (HOSPITAL_COMMUNITY): Payer: Self-pay

## 2022-12-13 ENCOUNTER — Other Ambulatory Visit: Payer: Self-pay

## 2022-12-13 MED ORDER — HYDROXYCHLOROQUINE SULFATE 200 MG PO TABS
200.0000 mg | ORAL_TABLET | Freq: Two times a day (BID) | ORAL | 5 refills | Status: DC
  Filled 2022-12-13: qty 60, 30d supply, fill #0
  Filled 2023-02-19: qty 60, 30d supply, fill #1
  Filled 2023-04-30: qty 60, 30d supply, fill #2
  Filled 2023-06-26: qty 60, 30d supply, fill #3
  Filled 2023-08-14: qty 60, 30d supply, fill #4
  Filled 2023-10-30: qty 60, 30d supply, fill #5

## 2022-12-23 ENCOUNTER — Ambulatory Visit
Admission: RE | Admit: 2022-12-23 | Discharge: 2022-12-23 | Disposition: A | Discharge status: Home / Self Care | Payer: No Typology Code available for payment source | Source: Ambulatory Visit | Attending: Obstetrics and Gynecology | Admitting: Obstetrics and Gynecology

## 2022-12-23 DIAGNOSIS — Z1239 Encounter for other screening for malignant neoplasm of breast: Secondary | ICD-10-CM | POA: Diagnosis not present

## 2022-12-23 DIAGNOSIS — Z803 Family history of malignant neoplasm of breast: Secondary | ICD-10-CM

## 2022-12-23 MED ORDER — GADOPICLENOL 0.5 MMOL/ML IV SOLN
6.0000 mL | Freq: Once | INTRAVENOUS | Status: AC | PRN
  Administered 2022-12-23: 6 mL via INTRAVENOUS

## 2023-01-02 ENCOUNTER — Encounter: Payer: Self-pay | Admitting: Sports Medicine

## 2023-01-02 ENCOUNTER — Ambulatory Visit (INDEPENDENT_AMBULATORY_CARE_PROVIDER_SITE_OTHER): Payer: 59 | Admitting: Sports Medicine

## 2023-01-02 ENCOUNTER — Other Ambulatory Visit: Payer: Self-pay

## 2023-01-02 ENCOUNTER — Other Ambulatory Visit (INDEPENDENT_AMBULATORY_CARE_PROVIDER_SITE_OTHER): Payer: 59

## 2023-01-02 DIAGNOSIS — G8929 Other chronic pain: Secondary | ICD-10-CM | POA: Diagnosis not present

## 2023-01-02 DIAGNOSIS — M25562 Pain in left knee: Secondary | ICD-10-CM | POA: Diagnosis not present

## 2023-01-02 DIAGNOSIS — S83242D Other tear of medial meniscus, current injury, left knee, subsequent encounter: Secondary | ICD-10-CM | POA: Diagnosis not present

## 2023-01-02 DIAGNOSIS — M1712 Unilateral primary osteoarthritis, left knee: Secondary | ICD-10-CM

## 2023-01-02 MED ORDER — METHYLPREDNISOLONE ACETATE 40 MG/ML IJ SUSP
80.0000 mg | INTRAMUSCULAR | Status: AC | PRN
  Administered 2023-01-02: 80 mg via INTRA_ARTICULAR

## 2023-01-02 MED ORDER — MELOXICAM 15 MG PO TABS
15.0000 mg | ORAL_TABLET | Freq: Every day | ORAL | 1 refills | Status: AC
  Filled 2023-01-02: qty 30, 30d supply, fill #0
  Filled 2023-01-28: qty 30, 30d supply, fill #1

## 2023-01-02 MED ORDER — LIDOCAINE HCL 1 % IJ SOLN
2.0000 mL | INTRAMUSCULAR | Status: AC | PRN
  Administered 2023-01-02: 2 mL

## 2023-01-02 MED ORDER — BUPIVACAINE HCL 0.25 % IJ SOLN
2.0000 mL | INTRAMUSCULAR | Status: AC | PRN
  Administered 2023-01-02: 2 mL via INTRA_ARTICULAR

## 2023-01-02 NOTE — Patient Instructions (Addendum)
Molly Vance,  It was a pleasure meeting you today. I hope you feel comfortable with our plan for your knee.  Injections to do some research on:  1.) Hyaluronic acid (gel or visco injections) 2.) PRP (platelet rich plasma) injections, handout below  Things for you to do:  - ice the knee for 15-20 mins 2-3x day for next 72 hours - Meloxicam  daily - begin home exercises starting Friday - f/u in 6 weeks   Dr. Shon Baton' Instructions and What to Expect for PRP Injections:  Platelet-rich plasma is used in musculoskeletal medicine to focus your own body's ability to heal. It has several well-done published randomized control trials (RCT) which demonstrate both its effectiveness and safety in many musculoskeletal conditions, including osteoarthritis, tendinopathies, and damaged vertebral discs. PRP has been in clinical use since the 1990's. Many people know that platelets form a clot if there is a cut in the skin. It turns out that platelets do not only form a clot, they also start the body's own repair process. When platelets activate to form a clot, they also release alpha granules which have hundreds of chemical messengers in them that initiate and organize repair to the damaged tissue. Precisely placing PRP into the site of injury will initiate the healing process by activating on the damaged cartilage or tendon. This is an inflammatory process, and inflammation is the vital first phase of Healing.  What to expect and how to prepare for PRP   2 weeks prior to the procedure: depending on the procedure, you may need to arrange for a driver to bring you home. IF you are having a lower extremity procedure, we can provide crutches as needed.   10 days prior to the procedure: Stop taking anti-inflammatory drugs like Ibuprofen/Motrin, Advil, Naprosyn, Celebrex, or Meloxicam. Even aspirin should be stopped (but need to discuss this with Dr. Shon Baton and your cardiologist beforehand). Let Dr.  Shon Baton know if you have been taking prednisone or other corticosteroids in the last month.   The day before the procedure: thoroughly shower and clean your skin.    The day of the procedure: Wear loose-fitting clothing like sweatpants or shorts. If you are having an upper body procedure wear a top that can button or zip up.  PRP will initiate healing and a productive inflammation, and PRP therapy will make the body part treated sore for 4 days to two weeks. Anti-inflammatory drugs (i.e. ibuprofen, Naprosyn, Celebrex) and corticosteroids such as prednisone can blunt or stop this process, so it is important to not take any anti-inflammatory drugs for 7 days before getting PRP therapy, or for at least three weeks after PRP therapy. Corticosteroid injections can blunt inflammation for 30 days, so let us know if you have had one recently. Depending on the body part injected, you may be in a sling or on crutches for several days. Just like wringing out a wet dishcloth, if you load or tense a tendon or ligament that has just been injected with PRP, some of the PRP injected will squish out. By keeping the body part treated relaxed by using a sling (for the shoulder or arm) or crutches (for hips and legs) for a few days, the PRP can bind in place and do its job.   You may need a driver to bring you home.  Tobacco/nicotine is a potent toxin and its use constricts small blood vessels which are needed for tissue repair.  Tobacco/nicotine use will limit the effectiveness of any treatment and  stopping tobacco use is one of the single  greatest actions you can take to improve your health. Avoid toxins like alcohol, which inhibits and depresses the cells needed for tissue repair.  What happens during the PRP procedure?  Platelet rich plasma is made by taking some of your blood and performing a two-stage centrifuge process on it to concentrate the PRP. First, your blood is drawn into a syringe with  a small amount of anti-coagulant in it (this is to keep the blood from clotting during this process). The amount of blood drawn is usually about 10-30 milliliters, depending on how much PRP is needed for the treatment.  (There are 355 milliliters in a 12-ounce soda can for comparison).  Then the blood is transferred in a sterile fashion into a centrifuge tube. It is then centrifuged for the first cycle where the red blood cells are isolated and discarded. In the second centrifuge cycle, the platelet-rich fraction of the remaining plasma is concentrated and placed in a syringe. The skin at the injection site is numbed with a small amount of topical cooling spray. Dr. Shon Baton will then precisely inject the PRP into the injury site using ultrasound guidance.  What to do after your procedure  I will give you specific medicine to control any discomfort you may have after the procedure. Avoid NSAIDs like ibuprofen. Acetaminophen can be used for mild pain.  Depending on the part of the body treated, usually you will be placed in a sling or on crutches for 1 to 3 days. Do your best not to tense or load the treated area during this time. After 3 days, unless otherwise instructed, the treated body part should be used and slowly moved through its full range of motion. It will be sore, but you will not be doing damage by moving it, in fact it needs to move to heal. If you were on crutches for a period of time, walking is ok once you are off the crutches. For now, avoid activities that specifically hurt you before being treated. Exercise is vital to good health and finding a way to cross train around your injury is important not only for your physical health, but for your mental health as well. Ask me about cross training options for your injury. Some brief (10 minutes or less) period of heat or ice therapy will not hurt the therapy, but it is not required. Usually, depending on the initial injury,  physical therapy is started from two weeks to four weeks after injection. Improvements in pain and function should be expected from 8 weeks to 12 weeks after injection and some injuries may require more than one treatment.

## 2023-01-02 NOTE — Progress Notes (Signed)
Chronic knee pain; worse over the last month History of meniscectomy with Dr. August Saucer in 2021 Pain worse at night Complains of swelling Takes advil PRN pain

## 2023-01-02 NOTE — Progress Notes (Signed)
Molly Vance - 62 y.o. female MRN [id number]  Date of birth: 1961-04-23  Office Visit Note: Visit Date: 01/02/2023 PCP: Creola Corn, MD Referred by: Creola Corn, MD  Subjective: Chief Complaint  Patient presents with   Left Knee - Pain   HPI: Molly Vance is a pleasant 62 y.o. female who presents today for acute on chronic left knee pain.  She has a history of left knee arthroscopy with debridement posterior medial meniscectomy back in March of 2021. She developed DVT postoperatively, treated 3 months of Xarelto then stopped.  Molly Vance tells me today that following her knee arthroscopy with meniscectomy she did get about 80% better.  She still has some pain over the medial aspect of the knee, but Dr. Algis Downs did warn her that this would be possible.  Her knee pain has been doing better until the last month when she has had some increasing pain and intermittent swelling in the knee that goes up and down.  Noticed this after doing a lot more walking in [place].  She has been noticing some clicking as well as episodes of feeling the knee will give out on her over this last month.  She has taken few doses of an older prescription for meloxicam and has been icing.  Presents today for next steps.  Pertinent ROS were reviewed with the patient and found to be negative unless otherwise specified above in HPI.   Assessment & Plan: Visit Diagnoses:  1. Chronic pain of left knee   2. Unilateral primary osteoarthritis, left knee   3. Other tear of medial meniscus, current injury, left knee, subsequent encounter    Plan: I discussed with Molly Vance the possible etiology of her knee pain.  She is about 3 years out from a prior posterior medial meniscal tear with meniscectomy.  She did get rather good improvement following this, however her knee was never quite the same.  Here over the last month or so she feels like the knee has been exacerbated.  She does have provocative maneuvers suggestive of possible  medial meniscal injury or tear, however on her x-rays she does have advancement of her medial tibiofemoral arthritic change and joint space narrowing.  She is trying to avoid surgical options at this time, which I feel are very reasonable.  We discussed all treatment options such as oral medication therapy, home rehab versus formal PT, injection therapy, advanced imaging.  We did proceed with one-time corticosteroid injection into the knee joint.  She tolerated well.  We will also get her started on meloxicam 15 mg to be taken once daily.  I would like her to ice and have modified rest for the next 48 to 72 hours from her injection.  We did print out a customized handout for home exercises for her, starting on Friday she may begin these once daily to help support and stabilize the knee.  Did recommend her body helix neoprene compression sleeve with standing and with walking activity.  We did discuss the role for additional treatment such as PRP injection therapy, hyaluronic acid, formal physical therapy.  She will follow-up in about 6 weeks for reevaluation.  Follow-up: Return in about 6 weeks (around 02/13/2023) for for left knee.   Meds & Orders:  Meds ordered this encounter  Medications   meloxicam (MOBIC) 15 MG tablet    Sig: Take 1 tablet (15 mg total) by mouth daily.    Dispense:  30 tablet    Refill:  1  Orders Placed This Encounter  Procedures   Large Joint Inj   XR Knee Complete 4 Views Left     Procedures: Large Joint Inj: L knee on 01/02/2023 4:42 PM Details: 22 G 1.5 in needle, anteromedial approach Medications: 2 mL lidocaine 1 %; 2 mL bupivacaine 0.25 %; 80 mg methylPREDNISolone acetate 40 MG/ML Outcome: tolerated well, no immediate complications  Knee Injection, left: After discussion on risks/benefits/indications, informed verbal consent was obtained and a timeout was performed, patient was seated on exam table. The patient's knee was prepped with Betadine and alcohol swab and  utilizing anteromedial approach, the patient's knee was injected intraarticularly with 2:2:2 lidocaine 1%:bupivicaine 0.25%:depomedrol. Patient tolerated the procedure well without immediate complications.  Procedure, treatment alternatives, risks and benefits explained, specific risks discussed. Consent was given by the patient. Immediately prior to procedure a time out was called to verify the correct patient, procedure, equipment, support staff and site/side marked as required. Patient was prepped and draped in the usual sterile fashion.          Clinical History: No specialty comments available.  She reports that she has never smoked. She has never used smokeless tobacco. No results for input(s): "HGBA1C", "LABURIC" in the last 8760 hours.  Objective:    Physical Exam  Gen: Well-appearing, in no acute distress; non-toxic CV: Well-perfused. Warm.  Resp: Breathing unlabored on room air; no wheezing. Psych: Fluid speech in conversation; appropriate affect; normal thought process Neuro: Sensation intact throughout. No gross coordination deficits.   Ortho Exam - Left knee: Inspection of the left knee shows a trace effusion without warmth or redness.  There is positive TTP over the medial aspect of the knee joint.  There is crepitus noted with flexion and extension.  Range of motion from 0-125 degrees.  There is positive McMurray's and Thessaly's testing over the medial compartment.  Strength 5/5 with knee flexion and extension.  Neurovascular intact.  Imaging: XR Knee Complete 4 Views Left  Result Date: 01/02/2023 4 views of the left knee including AP standing, Rosenberg, sunrise and lateral view were ordered and reviewed by myself.  X-rays demonstrate moderate to severe medial joint space narrowing with not quite bone-on-bone change on the [place] view.  The lateral joint line is well-preserved, there is moderate patellofemoral arthritis.  Very small spur off the lateral femoral condyle.   Otherwise no acute bony abnormality noted.   Past Medical/Family/Surgical/Social History: Medications & Allergies reviewed per EMR, new medications updated. Patient Active Problem List   Diagnosis Date Noted   Menopausal hot flushes 07/12/2011   Menopause    Allergy    Hyperlipidemia    Past Medical History:  Diagnosis Date   Allergy    Headache    Hyperlipidemia    Insomnia    Menopause    MMT (medial meniscus tear)    left   Wears glasses    Family History  Problem Relation Age of Onset   Diabetes Mother    Hypertension Mother    Diabetes Sister    Diabetes Maternal Grandmother    Breast cancer Sister    Ulcerative colitis Sister    Crohn's disease Sister    Colon cancer Neg Hx    Past Surgical History:  Procedure Laterality Date   BUNIONECTOMY  1984   CESAREAN SECTION  09/23/88   COLONOSCOPY     KNEE ARTHROSCOPY WITH MEDIAL MENISECTOMY Right 10/27/2014   Procedure: KNEE ARTHROSCOPY WITH PARTIAL MEDIAL MENISECTOMY, CYST DECOMPRESSION.;  Surgeon: Cammy Copa, MD;  Location: [facility];  Service: Orthopedics;  Laterality: Right;   KNEE ARTHROSCOPY WITH MEDIAL MENISECTOMY Left 12/22/2019   Procedure: LEFT KNEE ARTHROSCOPY, DEBRIDEMENT, WITH PARTIAL MEDIAL MENISECTOMY;  Surgeon: Cammy Copa, MD;  Location: [facility];  Service: Orthopedics;  Laterality: Left;   TONSILLECTOMY  1983   Social History   Occupational History   Not on file  Tobacco Use   Smoking status: Never   Smokeless tobacco: Never  Substance and Sexual Activity   Alcohol use: Yes    Alcohol/week: 6.0 standard drinks of alcohol    Types: 6 Glasses of wine per week    Comment: occasional   Drug use: No   Sexual activity: Yes    Birth control/protection: Post-menopausal

## 2023-01-29 ENCOUNTER — Other Ambulatory Visit: Payer: Self-pay

## 2023-02-13 ENCOUNTER — Ambulatory Visit (INDEPENDENT_AMBULATORY_CARE_PROVIDER_SITE_OTHER): Payer: 59 | Admitting: Sports Medicine

## 2023-02-13 ENCOUNTER — Encounter: Payer: Self-pay | Admitting: Sports Medicine

## 2023-02-13 DIAGNOSIS — M25562 Pain in left knee: Secondary | ICD-10-CM | POA: Diagnosis not present

## 2023-02-13 DIAGNOSIS — M1712 Unilateral primary osteoarthritis, left knee: Secondary | ICD-10-CM

## 2023-02-13 DIAGNOSIS — G8929 Other chronic pain: Secondary | ICD-10-CM | POA: Diagnosis not present

## 2023-02-13 NOTE — Progress Notes (Signed)
Molly Vance - 62 y.o. female MRN [id number]  Date of birth: Aug 06, 1961  Office Visit Note: Visit Date: 02/13/2023 PCP: Molly Corn, MD Referred by: Molly Corn, MD  Subjective: Chief Complaint  Patient presents with   Left Knee - Follow-up   HPI: Molly Vance is a pleasant 62 y.o. female who presents today for acute on chronic left knee pain with known osteoarthritis.   Knee CS-injection on 01/02/23.  This provided her only mild relief.  She has been very consistent with her home exercises for the knee and does note improvement in stability, she is no longer having episodes of the knee giving out on her.  Still continues with pain over the medial side of the knee, worse at nighttime.  Also has pain with going up and down hills/steps.  Using meloxicam 15 mg for the first few weeks but now transition to as needed.  Last dose was about 1 week ago.  She is interested in considering PRP injection therapy, as she would like to do everything in her power to avoid knee replacement or other surgical intervention at this time.  Pertinent ROS were reviewed with the patient and found to be negative unless otherwise specified above in HPI.   Assessment & Plan: Visit Diagnoses:  1. Unilateral primary osteoarthritis, left knee   2. Chronic pain of left knee    Plan: Discussed with Molly Vance treatment options for her chronic left knee pain.  I think this is very much due to her advanced medial tibiofemoral arthritis with joint space loss.  It is possible she has chronic meniscal degenerative changes but her Molly Vance view shows near bone-on-bone change of the medial compartment.  She will continue her home therapy daily as she is finding improvement with this and more stability.  At this point, she would like to move forward with a ultrasound-guided PRP injection therapy.  We will get her set up for this at greater than 10 days from her last NSAID use.  Did discuss pre and postinjection  protocol.  Reviewed healthy knee exercises including stationary bicycle/bicycle, aquatic therapy, elliptical use.  Follow-up: Return for set up for PRP injection into L-knee (30-min).   Meds & Orders: No orders of the defined types were placed in this encounter.  No orders of the defined types were placed in this encounter.    Procedures: No procedures performed      Clinical History: No specialty comments available.  She reports that she has never smoked. She has never used smokeless tobacco. No results for input(s): "HGBA1C", "LABURIC" in the last 8760 hours.  Objective:   Vital Signs: There were no vitals taken for this visit.  Physical Exam  Gen: Well-appearing, in no acute distress; non-toxic CV: Well-perfused. Warm.  Resp: Breathing unlabored on room air; no wheezing. Psych: Fluid speech in conversation; appropriate affect; normal thought process Neuro: Sensation intact throughout. No gross coordination deficits.   Ortho Exam - Left knee: Positive TTP over the medial aspect of the knee with mild bony bossing.  There is mild crepitus with knee flexion and extension.  Neurovascular intact distally.  Imaging: No results found.  Past Medical/Family/Surgical/Social History: Medications & Allergies reviewed per EMR, new medications updated. Patient Active Problem List   Diagnosis Date Noted   Menopausal hot flushes 07/12/2011   Menopause    Allergy    Hyperlipidemia    Past Medical History:  Diagnosis Date   Allergy    Headache    Hyperlipidemia  Insomnia    Menopause    MMT (medial meniscus tear)    left   Wears glasses    Family History  Problem Relation Age of Onset   Diabetes Mother    Hypertension Mother    Diabetes Sister    Diabetes Maternal Grandmother    Breast cancer Sister    Ulcerative colitis Sister    Crohn's disease Sister    Colon cancer Neg Hx    Past Surgical History:  Procedure Laterality Date   BUNIONECTOMY  1984   CESAREAN  SECTION  09/23/88   COLONOSCOPY     KNEE ARTHROSCOPY WITH MEDIAL MENISECTOMY Right 10/27/2014   Procedure: KNEE ARTHROSCOPY WITH PARTIAL MEDIAL MENISECTOMY, CYST DECOMPRESSION.;  Surgeon: Molly Copa, MD;  Location: [facility];  Service: Orthopedics;  Laterality: Right;   KNEE ARTHROSCOPY WITH MEDIAL MENISECTOMY Left 12/22/2019   Procedure: LEFT KNEE ARTHROSCOPY, DEBRIDEMENT, WITH PARTIAL MEDIAL MENISECTOMY;  Surgeon: Molly Copa, MD;  Location: [facility];  Service: Orthopedics;  Laterality: Left;   TONSILLECTOMY  1983   Social History   Occupational History   Not on file  Tobacco Use   Smoking status: Never   Smokeless tobacco: Never  Substance and Sexual Activity   Alcohol use: Yes    Alcohol/week: 6.0 standard drinks of alcohol    Types: 6 Glasses of wine per week    Comment: occasional   Drug use: No   Sexual activity: Yes    Birth control/protection: Post-menopausal

## 2023-02-13 NOTE — Progress Notes (Signed)
Doing ok; states she didn't think injection helped much Takes meloxicam as needed for severe pain; can't tell much of a difference with that   Exercises do help

## 2023-02-19 ENCOUNTER — Other Ambulatory Visit: Payer: Self-pay

## 2023-02-19 ENCOUNTER — Other Ambulatory Visit (HOSPITAL_COMMUNITY): Payer: Self-pay

## 2023-02-20 ENCOUNTER — Other Ambulatory Visit: Payer: Self-pay

## 2023-02-27 ENCOUNTER — Other Ambulatory Visit (HOSPITAL_COMMUNITY): Payer: Self-pay

## 2023-02-27 DIAGNOSIS — Z79899 Other long term (current) drug therapy: Secondary | ICD-10-CM | POA: Diagnosis not present

## 2023-02-27 DIAGNOSIS — H2513 Age-related nuclear cataract, bilateral: Secondary | ICD-10-CM | POA: Diagnosis not present

## 2023-02-28 ENCOUNTER — Other Ambulatory Visit (HOSPITAL_COMMUNITY): Payer: Self-pay

## 2023-03-01 ENCOUNTER — Other Ambulatory Visit (HOSPITAL_COMMUNITY): Payer: Self-pay

## 2023-03-02 ENCOUNTER — Ambulatory Visit: Payer: 59 | Admitting: Sports Medicine

## 2023-03-02 ENCOUNTER — Encounter: Payer: Self-pay | Admitting: Sports Medicine

## 2023-03-02 ENCOUNTER — Other Ambulatory Visit: Payer: Self-pay

## 2023-03-02 DIAGNOSIS — M1712 Unilateral primary osteoarthritis, left knee: Secondary | ICD-10-CM

## 2023-03-02 DIAGNOSIS — G8929 Other chronic pain: Secondary | ICD-10-CM

## 2023-03-02 DIAGNOSIS — M25562 Pain in left knee: Secondary | ICD-10-CM

## 2023-03-02 NOTE — Progress Notes (Signed)
   Procedure Note  Patient: CHAE SHUSTER             Date of Birth: [birth year]           MRN: [id number]             Visit Date: 03/02/2023  Procedures: Visit Diagnoses:  1. Unilateral primary osteoarthritis, left knee   2. Chronic pain of left knee    Ultrasound-guided PRP Knee Injection, Left: After discussion on risks/benefits/indications, informed verbal consent was obtained and a timeout was performed. The patient was lying supine on exam table with knee bolster underneath affected knee for comfort. The patient's right knee was prepped with Chloraprep and alcohol swabs. Utilizing ultrasound-guidance with the probe in a transverse position, the patient's suprapatellar bursa was identified and the surrounding soft tissue area was anesthesized first with a 25G, 1.5" needle with approximately 2 cc of lidocaine 1% using sterile technique, but none was delivered into the knee joint or bursa itself. Following this, using ultrasound guidance via an in-plane approach, a 22G, 1.5" needle was directed into the suprapatellar pouch of the knee joint and subsequently injected intraarticularly with 5.5 cc of platelet-rich-plasma (leukocyte-poor). Visualization of injectate spread within the knee joint was visualized dynamically under ultrasound guidance. Patient tolerated the procedure well without immediate complications.   Kit: RegenLab: RegenPlasma; BCT-3 kit   *Post-PRP Injection Guidelines: No anti-inflammatories (ibuprofen/motrin, aleve, meloxicam, etc.) for 2 weeks.  No ice for 2 weeks.  Short prescription of tramadol may be written if pain is severe, call if needed. Appropriate rest / bracing / and timeframe for beginning therapy discussed and patient endorsed understanding.  Madelyn Brunner, DO Primary Care Sports Medicine Physician  [facility] - Orthopedics  This note was dictated using Dragon naturally speaking software and may contain errors in syntax, spelling, or content which  have not been identified prior to signing this note.

## 2023-03-02 NOTE — Patient Instructions (Signed)
*  Post-PRP Injection Guidelines: - No anti-inflammatories (ibuprofen/motrin, aleve, meloxicam, etc.) for 2 weeks.  No ice for 2 weeks.   - Short prescription of tramadol may be written if pain is severe, call if needed. - It is normal to have an increase in your pain for the first 3 to 4 days, then it should start to ease off -You are to take it With only casual walking for the first week, hold on physical activity for the lower extremity until the 2-week mark.  This is when you should start your physical therapy/home rehab  - Healthy knee exercises including stationary bicycle/bicycle, aquatic therapy, elliptical use   - If you have any questions, don't hesistate to call or message  *Dr. Shon Baton

## 2023-03-06 ENCOUNTER — Ambulatory Visit: Payer: 59 | Admitting: Rehabilitative and Restorative Service Providers"

## 2023-03-13 ENCOUNTER — Other Ambulatory Visit (HOSPITAL_COMMUNITY): Payer: Self-pay

## 2023-03-14 ENCOUNTER — Other Ambulatory Visit (HOSPITAL_COMMUNITY): Payer: Self-pay

## 2023-03-16 ENCOUNTER — Other Ambulatory Visit (HOSPITAL_COMMUNITY): Payer: Self-pay

## 2023-03-19 ENCOUNTER — Encounter: Payer: Self-pay | Admitting: Rehabilitative and Restorative Service Providers"

## 2023-03-19 ENCOUNTER — Ambulatory Visit: Payer: 59 | Admitting: Rehabilitative and Restorative Service Providers"

## 2023-03-19 ENCOUNTER — Other Ambulatory Visit: Payer: Self-pay

## 2023-03-19 DIAGNOSIS — M6281 Muscle weakness (generalized): Secondary | ICD-10-CM | POA: Diagnosis not present

## 2023-03-19 DIAGNOSIS — R262 Difficulty in walking, not elsewhere classified: Secondary | ICD-10-CM

## 2023-03-19 DIAGNOSIS — G8929 Other chronic pain: Secondary | ICD-10-CM | POA: Diagnosis not present

## 2023-03-19 DIAGNOSIS — M25562 Pain in left knee: Secondary | ICD-10-CM | POA: Diagnosis not present

## 2023-03-19 NOTE — Therapy (Addendum)
OUTPATIENT PHYSICAL THERAPY EVALUATION   Patient Name: ANIS LEVENSTEIN MRN: [id number] DOB:07/19/1961, 62 y.o., female Today's Date: 03/19/2023  END OF SESSION:  PT End of Session - 03/19/23 1258     Visit Number 1    Number of Visits 20    Date for PT Re-Evaluation 05/28/23    Authorization Type [facility] AET[employer]    Progress Note Due on Visit 10    PT Start Time 1302    PT Stop Time 1340    PT Time Calculation (min) 38 min    Activity Tolerance Patient tolerated treatment well    Behavior During Therapy WFL for tasks assessed/performed             Past Medical History:  Diagnosis Date   Allergy    Headache    Hyperlipidemia    Insomnia    Menopause    MMT (medial meniscus tear)    left   Wears glasses    Past Surgical History:  Procedure Laterality Date   BUNIONECTOMY  1984   CESAREAN SECTION  09/23/88   COLONOSCOPY     KNEE ARTHROSCOPY WITH MEDIAL MENISECTOMY Right 10/27/2014   Procedure: KNEE ARTHROSCOPY WITH PARTIAL MEDIAL MENISECTOMY, CYST DECOMPRESSION.;  Surgeon: Cammy Copa, MD;  Location: [facility];  Service: Orthopedics;  Laterality: Right;   KNEE ARTHROSCOPY WITH MEDIAL MENISECTOMY Left 12/22/2019   Procedure: LEFT KNEE ARTHROSCOPY, DEBRIDEMENT, WITH PARTIAL MEDIAL MENISECTOMY;  Surgeon: Cammy Copa, MD;  Location: [facility];  Service: Orthopedics;  Laterality: Left;   TONSILLECTOMY  1983   Patient Active Problem List   Diagnosis Date Noted   Menopausal hot flushes 07/12/2011   Menopause    Allergy    Hyperlipidemia     PCP: Creola Corn MD  REFERRING PROVIDER: Madelyn Brunner, DO  REFERRING DIAG: [phone] (ICD-10-CM) - Unilateral primary osteoarthritis, left knee M25.562,G89.29 (ICD-10-CM) - Chronic pain of left knee  THERAPY DIAG:  Chronic pain of left knee  Muscle weakness (generalized)  Difficulty in walking, not elsewhere classified  Rationale for Evaluation and Treatment: Rehabilitation  ONSET DATE: March  2024  SUBJECTIVE:   SUBJECTIVE STATEMENT: She indicated increased pain troubles with walking on vacation trip.  Had cortisone injection that didn't help.  Had PRP injection but unsure of help.  Reported complaints at night time and activities listed below.   PERTINENT HISTORY: PRP injection 03/02/23. Hyperlipidemia.   History of surgery for meniscus 2021 Lt knee.   PAIN:  NPRS scale: at worst 3/10 Pain location: Lt knee anterior/medial Pain description: ache, sharp stabbing pain Aggravating factors: walking distance, transfers, stairs, getting down on knees.  Relieving factors: avoid activities of pain.  Has compression brace with walking at times.   PRECAUTIONS: None  WEIGHT BEARING RESTRICTIONS: No  FALLS:  Has patient fallen in last 6 months? No  LIVING ENVIRONMENT: Lives in: House/apartment Stairs: stair in house with garage.  3 stairs at entry with no rails.    OCCUPATION: Work at cancer center with standing prolonged.   PLOF: Independent , walking for exercise with dog (2 miles).   Has total gym at home, elliptical.   Has 74 year old grandkid  PATIENT GOALS: No pain, improve activities.    OBJECTIVE:   PATIENT SURVEYS:  03/19/2023 FOTO intake: 66    predicted:  75  COGNITION: 03/19/2023 Overall cognitive status: WFL    SENSATION: 03/19/2023 WFL  EDEMA:  03/19/2023 Unremarkable   MUSCLE LENGTH: 03/19/2023 No specific testing  POSTURE:  03/19/2023 Unremarkable   PALPATION: 03/19/2023 Mild tenderness medial Lt knee joint line   LOWER EXTREMITY MMT:   MMT Right 03/19/2023 Left 03/19/2023  Hip flexion 5/5 5/5  Hip extension 5/5 5/5  Hip abduction 5/5 4/5  Hip adduction    Hip internal rotation    Hip external rotation    Knee flexion 5/5 5/5  Knee extension 5/5 45, 44 lbs 5/5 31.9, 32 lbs  Ankle dorsiflexion 5/5 5/5  Ankle plantarflexion    Ankle inversion    Ankle eversion     (Blank rows = not tested)  LOWER EXTREMITY ROM:  ROM Right 03/19/2023  Left 03/19/2023  Hip flexion    Hip extension    Hip abduction    Hip adduction    Hip internal rotation    Hip external rotation    Knee flexion  WFL  Knee extension  0  Ankle dorsiflexion    Ankle plantarflexion    Ankle inversion    Ankle eversion     (Blank rows = not tested)  LOWER EXTREMITY SPECIAL TESTS:  03/19/2023 No specific testing.   FUNCTIONAL TESTS:  03/19/2023 18 inch chair transfer: able to perform s UE assist on 1st try, Rt leg weight shift noted mild Lt SLS: 30 seconds mild postural sway Rt SLS: 30 seconds mild postural sway Less knee flexion noted in SL squat attempt on Lt compared to Rt.   GAIT: 03/19/2023 Independent                                                                                                                                                                         TODAY'S TREATMENT                                                                          DATE: 03/19/2023 Therex:    HEP instruction/performance c cues for techniques, handout provided.  Trial set performed of each for comprehension and symptom assessment.  See below for exercise list  PATIENT EDUCATION:  03/19/2023 Education details: HEP, POC Person educated: Patient Education method: Explanation, Demonstration, Verbal cues, and Handouts Education comprehension: verbalized understanding, returned demonstration, and verbal cues required  HOME EXERCISE PROGRAM: Access Code: [email or web address] URL: [email or web address] Date: 03/19/2023 Prepared by: Chyrel Masson  Exercises - Sidelying Hip Abduction  - 1-2 x daily - 7 x weekly - 2-3 sets - 10-15 reps - Standing Hip Hiking  - 1-2 x daily - 7 x weekly -  1 sets - 10 reps - 5 hold - Seated Straight Leg Heel Taps  - 1-2 x daily - 7 x weekly - 2-3 sets - 10-15 reps - Sit to Stand  - 1-2 x daily - 7 x weekly - 2-3 sets - 10-15 reps - Single Leg Press  - 1 x daily - 3-4 x weekly - 2-3 sets - 10-15 reps  ASSESSMENT:  CLINICAL  IMPRESSION: Patient is a 62 y.o. who comes to clinic with complaints of Lt knee pain with mobility, strength and movement coordination deficits that impair their ability to perform usual daily and recreational functional activities without increase difficulty/symptoms at this time.  Patient to benefit from skilled PT services to address impairments and limitations to improve to previous level of function without restriction secondary to condition.   OBJECTIVE IMPAIRMENTS: decreased activity tolerance, decreased balance, decreased coordination, decreased endurance, decreased mobility, difficulty walking, decreased strength, improper body mechanics, and pain.   ACTIVITY LIMITATIONS: lifting, bending, sitting, standing, squatting, stairs, transfers, locomotion level, and caring for others  PARTICIPATION LIMITATIONS: cleaning, interpersonal relationship, shopping, community activity, and occupation  PERSONAL FACTORS:  no specific factors   are also affecting patient's functional outcome.   REHAB POTENTIAL: Good  CLINICAL DECISION MAKING: Stable/uncomplicated  EVALUATION COMPLEXITY: Low   GOALS: Goals reviewed with patient? Yes  SHORT TERM GOALS: (target date for Short term goals are 3 weeks 04/09/2023)   1.  Patient will demonstrate independent use of home exercise program to maintain progress from in clinic treatments.  Goal status: New  LONG TERM GOALS: (target dates for all long term goals are 10 weeks  05/28/2023 )   1. Patient will demonstrate/report pain at worst less than or equal to 2/10 to facilitate minimal limitation in daily activity secondary to pain symptoms.  Goal status: New   2. Patient will demonstrate independent use of home exercise program to facilitate ability to maintain/progress functional gains from skilled physical therapy services.  Goal status: New   3. Patient will demonstrate FOTO outcome > or = 75 % to indicate reduced disability due to condition.  Goal  status: New   4.  Patient will demonstrate Lt  LE MMT 5/5 throughout, Lt knee extension dynamometry > or = 40 lbs  to faciltiate usual transfers, stairs, squatting at PLOF for daily life.   Goal status: New   5.  Patient will demonstrate ability to ascend/descend stairs c reciprocal gait pattern at PLOF s limitation.  Goal status: New   6.  Patient will demonstrate/report return to workout and walking plan at [facility].  Goal status: New    PLAN:  PT FREQUENCY:  up to 1-2x/week  PT DURATION: up to 10 weeks  PLANNED INTERVENTIONS: Therapeutic exercises, Therapeutic activity, Neuro Muscular re-education, Balance training, Gait training, Patient/Family education, Joint mobilization, Stair training, DME instructions, Dry Needling, Electrical stimulation, Traction, Cryotherapy, vasopneumatic deviceMoist heat, Taping, Ultrasound, Ionotophoresis 4mg /ml Dexamethasone, and aquatic therapy, Manual therapy.  All included unless contraindicated  PLAN FOR NEXT SESSION: Review HEP knowledge/results.   Progressive strengthening improvements.  Recheck Knee extension dynamometry.    Chyrel Masson, PT, DPT, OCS, ATC 03/19/23  1:58 PM

## 2023-03-22 ENCOUNTER — Other Ambulatory Visit (HOSPITAL_COMMUNITY): Payer: Self-pay

## 2023-04-02 ENCOUNTER — Encounter: Payer: Self-pay | Admitting: Rehabilitative and Restorative Service Providers"

## 2023-04-02 ENCOUNTER — Ambulatory Visit: Payer: 59 | Admitting: Rehabilitative and Restorative Service Providers"

## 2023-04-02 DIAGNOSIS — M6281 Muscle weakness (generalized): Secondary | ICD-10-CM

## 2023-04-02 DIAGNOSIS — G8929 Other chronic pain: Secondary | ICD-10-CM

## 2023-04-02 DIAGNOSIS — R262 Difficulty in walking, not elsewhere classified: Secondary | ICD-10-CM | POA: Diagnosis not present

## 2023-04-02 DIAGNOSIS — M25562 Pain in left knee: Secondary | ICD-10-CM

## 2023-04-02 NOTE — Therapy (Addendum)
OUTPATIENT PHYSICAL THERAPY TREATMENT / DISCHARGE   Patient Name: Molly Vance MRN: [id number] DOB:03-06-1961, 62 y.o., female Today's Date: 04/02/2023  END OF SESSION:  PT End of Session - 04/02/23 1449     Visit Number 2    Number of Visits 20    Date for PT Re-Evaluation 05/28/23    Authorization Type [facility] AETNA    Progress Note Due on Visit 10    PT Start Time 1430    PT Stop Time 1503    PT Time Calculation (min) 33 min    Activity Tolerance Patient tolerated treatment well    Behavior During Therapy WFL for tasks assessed/performed              Past Medical History:  Diagnosis Date   Allergy    Headache    Hyperlipidemia    Insomnia    Menopause    MMT (medial meniscus tear)    left   Wears glasses    Past Surgical History:  Procedure Laterality Date   BUNIONECTOMY  1984   CESAREAN SECTION  09/23/88   COLONOSCOPY     KNEE ARTHROSCOPY WITH MEDIAL MENISECTOMY Right 10/27/2014   Procedure: KNEE ARTHROSCOPY WITH PARTIAL MEDIAL MENISECTOMY, CYST DECOMPRESSION.;  Surgeon: Cammy Copa, MD;  Location: [facility];  Service: Orthopedics;  Laterality: Right;   KNEE ARTHROSCOPY WITH MEDIAL MENISECTOMY Left 12/22/2019   Procedure: LEFT KNEE ARTHROSCOPY, DEBRIDEMENT, WITH PARTIAL MEDIAL MENISECTOMY;  Surgeon: Cammy Copa, MD;  Location: [facility];  Service: Orthopedics;  Laterality: Left;   TONSILLECTOMY  1983   Patient Active Problem List   Diagnosis Date Noted   Menopausal hot flushes 07/12/2011   Menopause    Allergy    Hyperlipidemia     PCP: Creola Corn MD  REFERRING PROVIDER: Madelyn Brunner, DO  REFERRING DIAG: [phone] (ICD-10-CM) - Unilateral primary osteoarthritis, left knee M25.562,G89.29 (ICD-10-CM) - Chronic pain of left knee  THERAPY DIAG:  Chronic pain of left knee  Muscle weakness (generalized)  Difficulty in walking, not elsewhere classified  Rationale for Evaluation and Treatment: Rehabilitation  ONSET DATE:  March 2024  SUBJECTIVE:   SUBJECTIVE STATEMENT: Pt indicated no specific pain upon arrival , just " alittle here and there"   PERTINENT HISTORY: PRP injection 03/02/23. Hyperlipidemia.   History of surgery for meniscus 2021 Lt knee.   PAIN:  NPRS scale: no specific pain upon arrival on the number scale.  Pain location: Lt knee anterior/medial Pain description: ache, sharp stabbing pain Aggravating factors: walking distance, transfers, stairs, getting down on knees.  Relieving factors: avoid activities of pain.  Has compression brace with walking at times.   PRECAUTIONS: None  WEIGHT BEARING RESTRICTIONS: No  FALLS:  Has patient fallen in last 6 months? No  LIVING ENVIRONMENT: Lives in: House/apartment Stairs: stair in house with garage.  3 stairs at entry with no rails.    OCCUPATION: Work at cancer center with standing prolonged.   PLOF: Independent , walking for exercise with dog (2 miles).   Has total gym at home, elliptical.   Has 17 year old grandkid  PATIENT GOALS: No pain, improve activities.    OBJECTIVE:   PATIENT SURVEYS:  04/02/2023:  FOTO update:  65  03/19/2023  FOTO intake: 66    predicted:  75  COGNITION: 03/19/2023 Overall cognitive status: WFL    SENSATION: 03/19/2023 WFL  EDEMA:  03/19/2023 Unremarkable   MUSCLE LENGTH: 03/19/2023 No specific testing  POSTURE:  03/19/2023 Unremarkable  PALPATION: 03/19/2023 Mild tenderness medial Lt knee joint line   LOWER EXTREMITY MMT:   MMT Right 03/19/2023 Left 03/19/2023 Left 04/02/2023  Hip flexion 5/5 5/5   Hip extension 5/5 5/5   Hip abduction 5/5 4/5   Hip adduction     Hip internal rotation     Hip external rotation     Knee flexion 5/5 5/5   Knee extension 5/5 45, 44 lbs 5/5 31.9, 32 lbs 5/5 49.4, 48.8 lbs  Ankle dorsiflexion 5/5 5/5   Ankle plantarflexion     Ankle inversion     Ankle eversion      (Blank rows = not tested)  LOWER EXTREMITY ROM:  ROM Right 03/19/2023 Left 03/19/2023   Hip flexion    Hip extension    Hip abduction    Hip adduction    Hip internal rotation    Hip external rotation    Knee flexion  WFL  Knee extension  0  Ankle dorsiflexion    Ankle plantarflexion    Ankle inversion    Ankle eversion     (Blank rows = not tested)  LOWER EXTREMITY SPECIAL TESTS:  03/19/2023 No specific testing.   FUNCTIONAL TESTS:  03/19/2023 18 inch chair transfer: able to perform s UE assist on 1st try, Rt leg weight shift noted mild Lt SLS: 30 seconds mild postural sway Rt SLS: 30 seconds mild postural sway Less knee flexion noted in SL squat attempt on Lt compared to Rt.   GAIT: 03/19/2023 Independent                                                                                                                                                                         TODAY'S TREATMENT                                                                          DATE: 04/02/2023 Therex: Recumbent bike lvl 4 8 mins Leg press double leg 100 lbs 2 x 15, single leg 2 x 15 50 lbs performed bilaterally SLR seated x 15 , performed bilaterally  Review of exiting HEP for technique review.     TODAY'S TREATMENT  DATE: 03/19/2023 Therex:    HEP instruction/performance c cues for techniques, handout provided.  Trial set performed of each for comprehension and symptom assessment.  See below for exercise list  PATIENT EDUCATION:  03/19/2023 Education details: HEP, POC Person educated: Patient Education method: Explanation, Demonstration, Verbal cues, and Handouts Education comprehension: verbalized understanding, returned demonstration, and verbal cues required  HOME EXERCISE PROGRAM: Access Code: [email or web address] URL: [email or web address] Date: 03/19/2023 Prepared by: Chyrel Masson  Exercises - Sidelying Hip Abduction  - 1-2 x daily - 7 x weekly - 2-3 sets - 10-15 reps - Standing Hip  Hiking  - 1-2 x daily - 7 x weekly - 1 sets - 10 reps - 5 hold - Seated Straight Leg Heel Taps  - 1-2 x daily - 7 x weekly - 2-3 sets - 10-15 reps - Sit to Stand  - 1-2 x daily - 7 x weekly - 2-3 sets - 10-15 reps - Single Leg Press  - 1 x daily - 3-4 x weekly - 2-3 sets - 10-15 reps  ASSESSMENT:  CLINICAL IMPRESSION: At this time, Pt demonstrated good gains in strength as measured.  Good knowledge of HEP noted at this time.  Due to overall improvements, recommend trial HEP and return if necessary.  Pt was in agreement with plan.    OBJECTIVE IMPAIRMENTS: decreased activity tolerance, decreased balance, decreased coordination, decreased endurance, decreased mobility, difficulty walking, decreased strength, improper body mechanics, and pain.   ACTIVITY LIMITATIONS: lifting, bending, sitting, standing, squatting, stairs, transfers, locomotion level, and caring for others  PARTICIPATION LIMITATIONS: cleaning, interpersonal relationship, shopping, community activity, and occupation  PERSONAL FACTORS:  no specific factors   are also affecting patient's functional outcome.   REHAB POTENTIAL: Good  CLINICAL DECISION MAKING: Stable/uncomplicated  EVALUATION COMPLEXITY: Low   GOALS: Goals reviewed with patient? Yes  SHORT TERM GOALS: (target date for Short term goals are 3 weeks 04/09/2023)   1.  Patient will demonstrate independent use of home exercise program to maintain progress from in clinic treatments.  Goal status: Met 04/02/2023  LONG TERM GOALS: (target dates for all long term goals are 10 weeks  05/28/2023 )   1. Patient will demonstrate/report pain at worst less than or equal to 2/10 to facilitate minimal limitation in daily activity secondary to pain symptoms.  Goal status: on going    2. Patient will demonstrate independent use of home exercise program to facilitate ability to maintain/progress functional gains from skilled physical therapy services.  Goal status: Met    3. Patient will demonstrate FOTO outcome > or = 75 % to indicate reduced disability due to condition.  Goal status: on going    4.  Patient will demonstrate Lt  LE MMT 5/5 throughout, Lt knee extension dynamometry > or = 40 lbs  to faciltiate usual transfers, stairs, squatting at PLOF for daily life.   Goal status: Met    5.  Patient will demonstrate ability to ascend/descend stairs c reciprocal gait pattern at PLOF s limitation.  Goal status: Met    6.  Patient will demonstrate/report return to workout and walking plan at [facility].  Goal status: Met    PLAN:  PT FREQUENCY:  up to 1-2x/week  PT DURATION: up to 10 weeks  PLANNED INTERVENTIONS: Therapeutic exercises, Therapeutic activity, Neuro Muscular re-education, Balance training, Gait training, Patient/Family education, Joint mobilization, Stair training, DME instructions, Dry Needling, Electrical stimulation, Traction, Cryotherapy, vasopneumatic deviceMoist heat, Taping, Ultrasound, Ionotophoresis 4mg /ml Dexamethasone, and aquatic therapy, Manual therapy.  All  included unless contraindicated  PLAN FOR NEXT SESSION: Hold PT at this time with trial HEP.    Chyrel Masson, PT, DPT, OCS, ATC 04/02/23  3:18 PM   PHYSICAL THERAPY DISCHARGE SUMMARY  Visits from Start of Care: 2  Current functional level related to goals / functional outcomes: See note   Remaining deficits: See note   Education / Equipment: HEP  Patient goals were met. Patient is being discharged due to not returning since the last visit.   Chyrel Masson, PT, DPT, OCS, ATC 05/23/23  3:31 PM

## 2023-04-03 DIAGNOSIS — L821 Other seborrheic keratosis: Secondary | ICD-10-CM | POA: Diagnosis not present

## 2023-04-03 DIAGNOSIS — L7211 Pilar cyst: Secondary | ICD-10-CM | POA: Diagnosis not present

## 2023-04-03 DIAGNOSIS — D2271 Melanocytic nevi of right lower limb, including hip: Secondary | ICD-10-CM | POA: Diagnosis not present

## 2023-04-03 DIAGNOSIS — L814 Other melanin hyperpigmentation: Secondary | ICD-10-CM | POA: Diagnosis not present

## 2023-04-03 DIAGNOSIS — D2262 Melanocytic nevi of left upper limb, including shoulder: Secondary | ICD-10-CM | POA: Diagnosis not present

## 2023-04-03 DIAGNOSIS — D2261 Melanocytic nevi of right upper limb, including shoulder: Secondary | ICD-10-CM | POA: Diagnosis not present

## 2023-04-03 DIAGNOSIS — D225 Melanocytic nevi of trunk: Secondary | ICD-10-CM | POA: Diagnosis not present

## 2023-04-03 DIAGNOSIS — L661 Lichen planopilaris: Secondary | ICD-10-CM | POA: Diagnosis not present

## 2023-04-03 DIAGNOSIS — L720 Epidermal cyst: Secondary | ICD-10-CM | POA: Diagnosis not present

## 2023-04-26 ENCOUNTER — Other Ambulatory Visit: Payer: Self-pay | Admitting: Oncology

## 2023-04-26 DIAGNOSIS — Z006 Encounter for examination for normal comparison and control in clinical research program: Secondary | ICD-10-CM

## 2023-04-30 ENCOUNTER — Other Ambulatory Visit (HOSPITAL_COMMUNITY): Payer: Self-pay

## 2023-05-07 DIAGNOSIS — H43811 Vitreous degeneration, right eye: Secondary | ICD-10-CM | POA: Diagnosis not present

## 2023-05-29 DIAGNOSIS — H1789 Other corneal scars and opacities: Secondary | ICD-10-CM | POA: Diagnosis not present

## 2023-05-29 DIAGNOSIS — H43811 Vitreous degeneration, right eye: Secondary | ICD-10-CM | POA: Diagnosis not present

## 2023-06-26 ENCOUNTER — Other Ambulatory Visit (HOSPITAL_COMMUNITY): Payer: Self-pay

## 2023-06-27 ENCOUNTER — Other Ambulatory Visit: Payer: Self-pay

## 2023-06-28 ENCOUNTER — Other Ambulatory Visit (HOSPITAL_COMMUNITY): Payer: Self-pay

## 2023-06-28 ENCOUNTER — Other Ambulatory Visit: Payer: Self-pay

## 2023-06-28 MED ORDER — ZOLPIDEM TARTRATE 5 MG PO TABS
5.0000 mg | ORAL_TABLET | Freq: Every evening | ORAL | 0 refills | Status: AC | PRN
  Filled 2023-06-28: qty 90, 90d supply, fill #0

## 2023-06-29 ENCOUNTER — Other Ambulatory Visit (HOSPITAL_COMMUNITY): Payer: Self-pay

## 2023-06-29 ENCOUNTER — Other Ambulatory Visit: Payer: Self-pay

## 2023-06-29 ENCOUNTER — Encounter (HOSPITAL_COMMUNITY): Payer: Self-pay

## 2023-06-29 MED ORDER — SODIUM FLUORIDE 1.1 % DT CREA
TOPICAL_CREAM | DENTAL | 2 refills | Status: AC
  Filled 2023-06-29: qty 102, 60d supply, fill #0
  Filled 2024-01-02: qty 102, 60d supply, fill #1
  Filled 2024-05-01: qty 102, 60d supply, fill #2

## 2023-07-31 ENCOUNTER — Other Ambulatory Visit (HOSPITAL_COMMUNITY): Payer: Self-pay

## 2023-08-14 ENCOUNTER — Other Ambulatory Visit: Payer: Self-pay

## 2023-08-14 ENCOUNTER — Other Ambulatory Visit (HOSPITAL_COMMUNITY): Payer: Self-pay

## 2023-08-14 MED ORDER — ROSUVASTATIN CALCIUM 5 MG PO TABS
5.0000 mg | ORAL_TABLET | Freq: Every day | ORAL | 3 refills | Status: DC
  Filled 2023-08-14: qty 90, 90d supply, fill #0
  Filled 2023-11-11: qty 90, 90d supply, fill #1
  Filled 2024-02-06: qty 90, 90d supply, fill #2
  Filled 2024-05-01: qty 90, 90d supply, fill #3

## 2023-08-15 ENCOUNTER — Other Ambulatory Visit: Payer: Self-pay

## 2023-08-20 ENCOUNTER — Other Ambulatory Visit (HOSPITAL_COMMUNITY): Payer: Self-pay

## 2023-09-06 DIAGNOSIS — H1032 Unspecified acute conjunctivitis, left eye: Secondary | ICD-10-CM | POA: Diagnosis not present

## 2023-09-17 DIAGNOSIS — Z13820 Encounter for screening for osteoporosis: Secondary | ICD-10-CM | POA: Diagnosis not present

## 2023-09-18 DIAGNOSIS — M8580 Other specified disorders of bone density and structure, unspecified site: Secondary | ICD-10-CM | POA: Diagnosis not present

## 2023-09-18 DIAGNOSIS — R739 Hyperglycemia, unspecified: Secondary | ICD-10-CM | POA: Diagnosis not present

## 2023-09-18 DIAGNOSIS — D72819 Decreased white blood cell count, unspecified: Secondary | ICD-10-CM | POA: Diagnosis not present

## 2023-09-18 DIAGNOSIS — Z0000 Encounter for general adult medical examination without abnormal findings: Secondary | ICD-10-CM | POA: Diagnosis not present

## 2023-09-18 DIAGNOSIS — E785 Hyperlipidemia, unspecified: Secondary | ICD-10-CM | POA: Diagnosis not present

## 2023-09-24 DIAGNOSIS — Z1212 Encounter for screening for malignant neoplasm of rectum: Secondary | ICD-10-CM | POA: Diagnosis not present

## 2023-09-25 DIAGNOSIS — R82998 Other abnormal findings in urine: Secondary | ICD-10-CM | POA: Diagnosis not present

## 2023-09-25 DIAGNOSIS — H10413 Chronic giant papillary conjunctivitis, bilateral: Secondary | ICD-10-CM | POA: Diagnosis not present

## 2023-09-25 DIAGNOSIS — H04123 Dry eye syndrome of bilateral lacrimal glands: Secondary | ICD-10-CM | POA: Diagnosis not present

## 2023-10-30 ENCOUNTER — Other Ambulatory Visit (HOSPITAL_COMMUNITY): Payer: Self-pay

## 2023-10-31 ENCOUNTER — Other Ambulatory Visit: Payer: Self-pay

## 2023-10-31 ENCOUNTER — Other Ambulatory Visit (HOSPITAL_COMMUNITY): Payer: Self-pay

## 2023-10-31 MED ORDER — ZOLPIDEM TARTRATE 5 MG PO TABS
5.0000 mg | ORAL_TABLET | Freq: Every evening | ORAL | 0 refills | Status: DC | PRN
  Filled 2023-10-31: qty 30, 30d supply, fill #0

## 2023-10-31 MED ORDER — CLONAZEPAM 0.5 MG PO TABS
0.5000 mg | ORAL_TABLET | Freq: Every evening | ORAL | 3 refills | Status: DC | PRN
  Filled 2023-10-31: qty 30, 30d supply, fill #0

## 2023-11-05 DIAGNOSIS — H16223 Keratoconjunctivitis sicca, not specified as Sjogren's, bilateral: Secondary | ICD-10-CM | POA: Diagnosis not present

## 2023-11-12 ENCOUNTER — Other Ambulatory Visit: Payer: Self-pay

## 2023-11-21 ENCOUNTER — Other Ambulatory Visit (HOSPITAL_COMMUNITY): Payer: Self-pay

## 2023-12-03 ENCOUNTER — Other Ambulatory Visit (HOSPITAL_COMMUNITY): Payer: Self-pay

## 2023-12-03 ENCOUNTER — Other Ambulatory Visit (HOSPITAL_BASED_OUTPATIENT_CLINIC_OR_DEPARTMENT_OTHER): Payer: Self-pay

## 2023-12-03 DIAGNOSIS — Z6826 Body mass index (BMI) 26.0-26.9, adult: Secondary | ICD-10-CM | POA: Diagnosis not present

## 2023-12-03 DIAGNOSIS — Z1151 Encounter for screening for human papillomavirus (HPV): Secondary | ICD-10-CM | POA: Diagnosis not present

## 2023-12-03 DIAGNOSIS — Z01419 Encounter for gynecological examination (general) (routine) without abnormal findings: Secondary | ICD-10-CM | POA: Diagnosis not present

## 2023-12-03 DIAGNOSIS — Z124 Encounter for screening for malignant neoplasm of cervix: Secondary | ICD-10-CM | POA: Diagnosis not present

## 2023-12-03 DIAGNOSIS — Z1231 Encounter for screening mammogram for malignant neoplasm of breast: Secondary | ICD-10-CM | POA: Diagnosis not present

## 2023-12-03 MED ORDER — TRAZODONE HCL 50 MG PO TABS
50.0000 mg | ORAL_TABLET | Freq: Every evening | ORAL | 6 refills | Status: AC | PRN
  Filled 2023-12-03: qty 30, 30d supply, fill #0

## 2023-12-04 DIAGNOSIS — L821 Other seborrheic keratosis: Secondary | ICD-10-CM | POA: Diagnosis not present

## 2023-12-04 DIAGNOSIS — L6611 Classic lichen planopilaris: Secondary | ICD-10-CM | POA: Diagnosis not present

## 2023-12-04 DIAGNOSIS — L720 Epidermal cyst: Secondary | ICD-10-CM | POA: Diagnosis not present

## 2024-01-02 ENCOUNTER — Other Ambulatory Visit: Payer: Self-pay

## 2024-01-02 ENCOUNTER — Other Ambulatory Visit (HOSPITAL_COMMUNITY): Payer: Self-pay

## 2024-01-02 MED ORDER — HYDROXYCHLOROQUINE SULFATE 200 MG PO TABS
200.0000 mg | ORAL_TABLET | Freq: Two times a day (BID) | ORAL | 5 refills | Status: AC
  Filled 2024-01-02: qty 60, 30d supply, fill #0
  Filled 2024-02-06: qty 60, 30d supply, fill #1
  Filled 2024-05-01: qty 60, 30d supply, fill #2
  Filled 2024-07-01: qty 60, 30d supply, fill #3
  Filled 2024-08-11: qty 60, 30d supply, fill #4
  Filled 2024-09-06: qty 60, 30d supply, fill #5

## 2024-01-02 MED ORDER — CLOBETASOL PROPIONATE 0.05 % EX SOLN
CUTANEOUS | 0 refills | Status: AC
  Filled 2024-01-02: qty 50, 30d supply, fill #0

## 2024-01-03 ENCOUNTER — Other Ambulatory Visit: Payer: Self-pay

## 2024-01-03 ENCOUNTER — Other Ambulatory Visit (HOSPITAL_COMMUNITY): Payer: Self-pay

## 2024-01-03 MED ORDER — ZOLPIDEM TARTRATE 5 MG PO TABS
5.0000 mg | ORAL_TABLET | Freq: Every evening | ORAL | 0 refills | Status: DC | PRN
  Filled 2024-01-03: qty 30, 30d supply, fill #0

## 2024-02-06 ENCOUNTER — Other Ambulatory Visit (HOSPITAL_COMMUNITY): Payer: Self-pay

## 2024-02-07 ENCOUNTER — Other Ambulatory Visit: Payer: Self-pay

## 2024-02-07 ENCOUNTER — Other Ambulatory Visit (HOSPITAL_COMMUNITY): Payer: Self-pay

## 2024-02-08 ENCOUNTER — Other Ambulatory Visit (HOSPITAL_COMMUNITY): Payer: Self-pay

## 2024-02-08 MED ORDER — ZOLPIDEM TARTRATE 5 MG PO TABS
5.0000 mg | ORAL_TABLET | Freq: Every evening | ORAL | 0 refills | Status: DC | PRN
  Filled 2024-02-08: qty 30, 30d supply, fill #0

## 2024-04-03 ENCOUNTER — Other Ambulatory Visit (HOSPITAL_COMMUNITY): Payer: Self-pay

## 2024-04-04 ENCOUNTER — Other Ambulatory Visit (HOSPITAL_COMMUNITY): Payer: Self-pay

## 2024-04-04 ENCOUNTER — Other Ambulatory Visit: Payer: Self-pay

## 2024-04-04 MED ORDER — ZOLPIDEM TARTRATE 5 MG PO TABS
5.0000 mg | ORAL_TABLET | Freq: Every evening | ORAL | 0 refills | Status: DC | PRN
  Filled 2024-04-04: qty 30, 30d supply, fill #0

## 2024-04-29 ENCOUNTER — Encounter: Payer: Self-pay | Admitting: Pediatrics

## 2024-05-02 ENCOUNTER — Other Ambulatory Visit: Payer: Self-pay

## 2024-05-02 ENCOUNTER — Other Ambulatory Visit (HOSPITAL_COMMUNITY): Payer: Self-pay

## 2024-05-13 DIAGNOSIS — D692 Other nonthrombocytopenic purpura: Secondary | ICD-10-CM | POA: Diagnosis not present

## 2024-05-13 DIAGNOSIS — L814 Other melanin hyperpigmentation: Secondary | ICD-10-CM | POA: Diagnosis not present

## 2024-05-13 DIAGNOSIS — D224 Melanocytic nevi of scalp and neck: Secondary | ICD-10-CM | POA: Diagnosis not present

## 2024-05-13 DIAGNOSIS — L821 Other seborrheic keratosis: Secondary | ICD-10-CM | POA: Diagnosis not present

## 2024-05-13 DIAGNOSIS — D225 Melanocytic nevi of trunk: Secondary | ICD-10-CM | POA: Diagnosis not present

## 2024-05-13 DIAGNOSIS — L720 Epidermal cyst: Secondary | ICD-10-CM | POA: Diagnosis not present

## 2024-05-13 DIAGNOSIS — D2261 Melanocytic nevi of right upper limb, including shoulder: Secondary | ICD-10-CM | POA: Diagnosis not present

## 2024-05-13 DIAGNOSIS — L7211 Pilar cyst: Secondary | ICD-10-CM | POA: Diagnosis not present

## 2024-05-13 DIAGNOSIS — L6611 Classic lichen planopilaris: Secondary | ICD-10-CM | POA: Diagnosis not present

## 2024-05-27 ENCOUNTER — Other Ambulatory Visit (HOSPITAL_COMMUNITY): Payer: Self-pay

## 2024-05-28 ENCOUNTER — Other Ambulatory Visit (HOSPITAL_COMMUNITY): Payer: Self-pay

## 2024-05-28 ENCOUNTER — Other Ambulatory Visit: Payer: Self-pay

## 2024-05-28 MED ORDER — ZOLPIDEM TARTRATE 5 MG PO TABS
5.0000 mg | ORAL_TABLET | Freq: Every evening | ORAL | 0 refills | Status: DC | PRN
  Filled 2024-05-28: qty 30, 30d supply, fill #0

## 2024-06-16 ENCOUNTER — Other Ambulatory Visit: Payer: Self-pay | Admitting: Medical Genetics

## 2024-06-16 DIAGNOSIS — Z006 Encounter for examination for normal comparison and control in clinical research program: Secondary | ICD-10-CM

## 2024-06-23 ENCOUNTER — Ambulatory Visit

## 2024-06-23 ENCOUNTER — Encounter: Payer: Self-pay | Admitting: Pediatrics

## 2024-06-23 VITALS — Ht 65.0 in | Wt 160.0 lb

## 2024-06-23 DIAGNOSIS — Z1211 Encounter for screening for malignant neoplasm of colon: Secondary | ICD-10-CM

## 2024-06-23 MED ORDER — NA SULFATE-K SULFATE-MG SULF 17.5-3.13-1.6 GM/177ML PO SOLN: 1.0000 | Freq: Once | ORAL | 0 refills | Status: AC

## 2024-06-23 NOTE — Progress Notes (Signed)

## 2024-07-01 ENCOUNTER — Other Ambulatory Visit: Payer: Self-pay

## 2024-07-07 NOTE — Progress Notes (Unsigned)
 [facility] History and Physical   Primary Care Physician:  Onita Rush, MD   Reason for Procedure:  Colorectal cancer screening  Plan:    Screening colonoscopy     HPI: Molly Vance is a 63 y.o. female undergoing screening colonoscopy for colorectal cancer screening.  Last colonoscopy was performed in 2015 and was normal.  No family history of colorectal cancer or polyps.  Patient denies current symptoms of rectal bleeding or change in bowel habits.   Past Medical History:  Diagnosis Date   Allergy    Headache    Hyperlipidemia    Insomnia    Menopause    MMT (medial meniscus tear)    left   Wears glasses     Past Surgical History:  Procedure Laterality Date   BUNIONECTOMY  1984   CESAREAN SECTION  09/23/88   COLONOSCOPY     KNEE ARTHROSCOPY WITH MEDIAL MENISECTOMY Right 10/27/2014   Procedure: KNEE ARTHROSCOPY WITH PARTIAL MEDIAL MENISECTOMY, CYST DECOMPRESSION.;  Surgeon: Cordella Glendia Hutchinson, MD;  Location: [facility];  Service: Orthopedics;  Laterality: Right;   KNEE ARTHROSCOPY WITH MEDIAL MENISECTOMY Left 12/22/2019   Procedure: LEFT KNEE ARTHROSCOPY, DEBRIDEMENT, WITH PARTIAL MEDIAL MENISECTOMY;  Surgeon: Hutchinson Cordella Glendia, MD;  Location: [facility];  Service: Orthopedics;  Laterality: Left;   TONSILLECTOMY  1983    Prior to Admission medications   Medication Sig Start Date End Date Taking? Authorizing Provider  azithromycin  (ZITHROMAX ) 250 MG tablet Take 2 tablets by mouth on day 1, then 1 tablet daily on days 2-5. 04/14/21     azithromycin  (ZITHROMAX ) 250 MG tablet Take 2 tablets by mouth on day 1, then take 1 tablet daily for 4 additional days 08/02/21     azithromycin  (ZITHROMAX ) 250 MG tablet Take 2 tablets by mouth once on the first day then take 1 tablet by mouth daily on days 2-5. 09/13/22     Cholecalciferol (VITAMIN D3) 1000 UNITS CAPS Take 1 capsule by mouth daily.      [provider]  clindamycin  (CLEOCIN  T) 1 % lotion  Apply 1 application on the skin twice a day Patient not taking: Reported on 06/23/2024 01/25/21     clobetasol  (TEMOVATE ) 0.05 % external solution Apply 1 mL daily 01/25/21     clobetasol  (TEMOVATE ) 0.05 % external solution Apply a small amount to scalp once a day. 01/02/24     clonazePAM  (KLONOPIN ) 0.5 MG tablet TAKE 1 TABLET BY MOUTH EVERY NIGHT AT BEDTIME AS NEEDED 07/06/20 01/02/21  Leva Rush, MD  clonazePAM  (KLONOPIN ) 0.5 MG tablet Take 1 tablet (0.5 mg total) by mouth at bedtime as needed. 10/31/23     conjugated estrogens  (PREMARIN ) vaginal cream INSERT 1/2 APPLICATORFUL VAGINALLY AT BEDTIME EVERY WEEK AS DIRECTED 05/25/21     COVID-19 At Home Antigen Test [facility] COVID-19 HOME TEST) KIT Use as directed within package instructions Patient not taking: Reported on 06/23/2024 05/24/21   Glade Jodie BROCKS, RPH  COVID-19 At Home Antigen Test [facility] COVID-19 HOME TEST) KIT Use as directed. Patient not taking: Reported on 06/23/2024 11/09/21   Belanger, Devere SAILOR, [facility]  cycloSPORINE  (RESTASIS ) 0.05 % ophthalmic emulsion Place 1 drop into both eyes 2 (two) times daily.      [provider]  cycloSPORINE  (RESTASIS ) 0.05 % ophthalmic emulsion Place 1 drop into both eyes 2 (two) times daily. 09/19/22     erythromycin  ophthalmic ointment Apply 1/2 inch ribbon to lower eyelid twice daily for 1 week as directed 09/13/22  estradiol (ESTRACE) 0.1 MG/GM vaginal cream Place 1 g vaginally 2 (two) times a week.      [provider]  fluorouracil  (EFUDEX ) 5 % cream Apply a small amount to affected area nightly x 2 weeks as directed Patient not taking: Reported on 06/23/2024 06/26/22     fluticasone  (FLONASE ) 50 MCG/ACT nasal spray Place 2 sprays into the nose daily.      [provider]  hydroquinone  4 % cream Apply 1 application on the skin twice a day 01/25/21     hydroxychloroquine  (PLAQUENIL ) 200 MG tablet Take 1 tablet by mouth twice a day 08/31/21     hydroxychloroquine  (PLAQUENIL )  200 MG tablet Take 1 tablet (200 mg total) by mouth 2 (two) times daily. 01/02/24     loratadine (CLARITIN) 10 MG tablet Take 10 mg by mouth daily.      [provider]  magnesium (MAGTAB) 84 MG ( ) TBCR SR tablet Take 84 mg by mouth.    [provider]  Melatonin 5 MG CHEW Chew by mouth.    [provider]  meloxicam  (MOBIC ) 15 MG tablet Take 1 tablet (15 mg total) by mouth daily. Patient not taking: Reported on 06/23/2024 01/02/23   Brooks, Dana, DO  methocarbamol  (ROBAXIN ) 500 MG tablet Take 1 tablet (500 mg total) by mouth every 8 (eight) hours as needed for muscle spasms. 12/22/19   Magnant, Carlin CROME, PA-C  Misc Natural Products [facility]) CAPS Take by mouth daily.    [provider]  olopatadine (PATANOL) 0.1 % ophthalmic solution Place 1 drop into both eyes 2 (two) times daily.      [provider]  OMEGA 3 1000 MG CAPS Take 2 capsules by mouth daily.      [provider]  Probiotic Product (CULTURELLE PROBIOTICS PO) Take by mouth.    [provider]  rosuvastatin  (CRESTOR ) 5 MG tablet Take 1 tablet (5 mg total) by mouth daily. 08/14/23     sodium fluoride  (PREVIDENT 5000 PLUS) 1.1 % CREA dental cream Brush teeth for 2 full minutes at bedtime and spit. Do not rinse for 30 minutes. 08/09/21     sodium fluoride  (PREVIDENT 5000 PLUS) 1.1 % CREA dental cream Brush teeth 2 full minutes before bedtime and spit. Do not rinse for 30 minutes. Patient not taking: Reported on 06/23/2024 06/28/23     Suvorexant  (BELSOMRA ) 10 MG TABS Take 1 tablet by mouth at bedtime as needed. 08/28/22     Tart Cherry 1200 MG CAPS Take 1,500 mg by mouth daily.    [provider]  traZODone  (DESYREL ) 50 MG tablet Take 1 tablet (50 mg total) by mouth at bedtime as needed. 12/03/23     triamcinolone (NASACORT) 55 MCG/ACT AERO nasal inhaler Place 2 sprays into the nose.    [provider]  triazolam  (HALCION ) 0.125 MG tablet Take 3 tablets  (0.375 mg total) by mouth 1 hour prior to appointment in office 10/09/22     Vitamin D, Ergocalciferol, (DRISDOL) 1.25 MG (50000 UNIT) CAPS capsule Take 50,000 Units by mouth every 7 (seven) days.    [provider]  zolpidem  (AMBIEN ) 5 MG tablet TAKE 1 TABLET BY MOUTH AT BEDTIME AS NEEDED Patient not taking: Reported on 06/23/2024 05/18/20 11/14/20  Onita Rush, MD  zolpidem  (AMBIEN ) 5 MG tablet TAKE 1 TABLET BY MOUTH AT BEDTIME AS NEEDED Patient not taking: Reported on 06/23/2024 11/09/21     zolpidem  (AMBIEN ) 5 MG tablet Take 1 tablet (5  mg total) by mouth at bedtime as needed. Patient not taking: Reported on 06/23/2024 08/30/22     zolpidem  (AMBIEN ) 5 MG tablet Take 1 tablet (5 mg total) by mouth at bedtime as needed. Patient not taking: Reported on 06/23/2024 06/28/23     zolpidem  (AMBIEN ) 5 MG tablet Take 1 tablet (5 mg total) by mouth at bedtime as needed. 05/28/24       Current Outpatient Medications  Medication Sig Dispense Refill   azithromycin  (ZITHROMAX ) 250 MG tablet Take 2 tablets by mouth on day 1, then 1 tablet daily on days 2-5. 6 tablet 0   azithromycin  (ZITHROMAX ) 250 MG tablet Take 2 tablets by mouth on day 1, then take 1 tablet daily for 4 additional days 6 tablet 0   azithromycin  (ZITHROMAX ) 250 MG tablet Take 2 tablets by mouth once on the first day then take 1 tablet by mouth daily on days 2-5. 6 tablet 0   Cholecalciferol (VITAMIN D3) 1000 UNITS CAPS Take 1 capsule by mouth daily.       clindamycin  (CLEOCIN  T) 1 % lotion Apply 1 application on the skin twice a day (Patient not taking: Reported on 06/23/2024) 60 mL 1   clobetasol  (TEMOVATE ) 0.05 % external solution Apply 1 mL daily 50 mL 3   clobetasol  (TEMOVATE ) 0.05 % external solution Apply a small amount to scalp once a day. 50 mL 0   clonazePAM  (KLONOPIN ) 0.5 MG tablet TAKE 1 TABLET BY MOUTH EVERY NIGHT AT BEDTIME AS NEEDED 30 tablet 3   clonazePAM  (KLONOPIN ) 0.5 MG tablet Take 1 tablet (0.5 mg total) by mouth at  bedtime as needed. 30 tablet 3   conjugated estrogens  (PREMARIN ) vaginal cream INSERT 1/2 APPLICATORFUL VAGINALLY AT BEDTIME EVERY WEEK AS DIRECTED 30 g 5   COVID-19 At Home Antigen Test (CARESTART COVID-19 HOME TEST) KIT Use as directed within package instructions (Patient not taking: Reported on 06/23/2024) 4 each 0   COVID-19 At Home Antigen Test (CARESTART COVID-19 HOME TEST) KIT Use as directed. (Patient not taking: Reported on 06/23/2024) 4 each 0   cycloSPORINE  (RESTASIS ) 0.05 % ophthalmic emulsion Place 1 drop into both eyes 2 (two) times daily.       cycloSPORINE  (RESTASIS ) 0.05 % ophthalmic emulsion Place 1 drop into both eyes 2 (two) times daily. 180 each 3   erythromycin  ophthalmic ointment Apply 1/2 inch ribbon to lower eyelid twice daily for 1 week as directed 3.5 g 0   estradiol (ESTRACE) 0.1 MG/GM vaginal cream Place 1 g vaginally 2 (two) times a week.       fluorouracil  (EFUDEX ) 5 % cream Apply a small amount to affected area nightly x 2 weeks as directed (Patient not taking: Reported on 06/23/2024) 40 g 0   fluticasone  (FLONASE ) 50 MCG/ACT nasal spray Place 2 sprays into the nose daily.       hydroquinone  4 % cream Apply 1 application on the skin twice a day 28.35 g 1   hydroxychloroquine  (PLAQUENIL ) 200 MG tablet Take 1 tablet by mouth twice a day 60 tablet 5   hydroxychloroquine  (PLAQUENIL ) 200 MG tablet Take 1 tablet (200 mg total) by mouth 2 (two) times daily. 60 tablet 5   loratadine (CLARITIN) 10 MG tablet Take 10 mg by mouth daily.       magnesium (MAGTAB) 84 MG ( ) TBCR SR tablet Take 84 mg by mouth.     Melatonin 5 MG CHEW Chew by mouth.     meloxicam  (MOBIC ) 15 MG tablet Take  1 tablet (15 mg total) by mouth daily. (Patient not taking: Reported on 06/23/2024) 30 tablet 1   methocarbamol  (ROBAXIN ) 500 MG tablet Take 1 tablet (500 mg total) by mouth every 8 (eight) hours as needed for muscle spasms. 30 tablet 0   Misc Natural Products (JOINT HEALTH) CAPS Take by mouth  daily.     olopatadine (PATANOL) 0.1 % ophthalmic solution Place 1 drop into both eyes 2 (two) times daily.       OMEGA 3 1000 MG CAPS Take 2 capsules by mouth daily.       Probiotic Product (CULTURELLE PROBIOTICS PO) Take by mouth.     rosuvastatin  (CRESTOR ) 5 MG tablet Take 1 tablet (5 mg total) by mouth daily. 90 tablet 3   sodium fluoride  (PREVIDENT 5000 PLUS) 1.1 % CREA dental cream Brush teeth for 2 full minutes at bedtime and spit. Do not rinse for 30 minutes. 102 g 2   sodium fluoride  (PREVIDENT 5000 PLUS) 1.1 % CREA dental cream Brush teeth 2 full minutes before bedtime and spit. Do not rinse for 30 minutes. (Patient not taking: Reported on 06/23/2024) 102 g 2   Suvorexant  (BELSOMRA ) 10 MG TABS Take 1 tablet by mouth at bedtime as needed. 30 tablet 1   Tart Cherry 1200 MG CAPS Take 1,500 mg by mouth daily.     traZODone  (DESYREL ) 50 MG tablet Take 1 tablet (50 mg total) by mouth at bedtime as needed. 30 tablet 6   triamcinolone (NASACORT) 55 MCG/ACT AERO nasal inhaler Place 2 sprays into the nose.     triazolam  (HALCION ) 0.125 MG tablet Take 3 tablets (0.375 mg total) by mouth 1 hour prior to appointment in office 3 tablet 0   Vitamin D, Ergocalciferol, (DRISDOL) 1.25 MG (50000 UNIT) CAPS capsule Take 50,000 Units by mouth every 7 (seven) days.     zolpidem  (AMBIEN ) 5 MG tablet TAKE 1 TABLET BY MOUTH AT BEDTIME AS NEEDED (Patient not taking: Reported on 06/23/2024) 90 tablet 1   zolpidem  (AMBIEN ) 5 MG tablet TAKE 1 TABLET BY MOUTH AT BEDTIME AS NEEDED (Patient not taking: Reported on 06/23/2024) 90 tablet 1   zolpidem  (AMBIEN ) 5 MG tablet Take 1 tablet (5 mg total) by mouth at bedtime as needed. (Patient not taking: Reported on 06/23/2024) 90 tablet 2   zolpidem  (AMBIEN ) 5 MG tablet Take 1 tablet (5 mg total) by mouth at bedtime as needed. (Patient not taking: Reported on 06/23/2024) 90 tablet 0   zolpidem  (AMBIEN ) 5 MG tablet Take 1 tablet (5 mg total) by mouth at bedtime as needed. 30  tablet 0   No current facility-administered medications for this visit.    Allergies as of 07/08/2024 - Review Complete 06/23/2024  Allergen Reaction Noted   Amoxicillin-pot clavulanate Hives 07/11/2011   Cefprozil Swelling 07/11/2011   Neosporin [neomycin-polymyxin-gramicidin] Rash 07/11/2011   Polysporin [bacitracin-polymyxin b] Rash 07/11/2011   Septra [bactrim] Swelling 07/11/2011    Family History  Problem Relation Age of Onset   Diabetes Mother    Hypertension Mother    Diabetes Sister    Breast cancer Sister    Ulcerative colitis Sister    Crohn's disease Sister    Diabetes Maternal Grandmother    Colon cancer Neg Hx    Esophageal cancer Neg Hx    Rectal cancer Neg Hx    Stomach cancer Neg Hx     Social History   Socioeconomic History   Marital status: Married    Spouse name: Not on file  Number of children: Not on file   Years of education: Not on file   Highest education level: Not on file  Occupational History   Not on file  Tobacco Use   Smoking status: Never   Smokeless tobacco: Never  Vaping Use   Vaping status: Never Used  Substance and Sexual Activity   Alcohol  use: Yes    Alcohol /week: 6.0 standard drinks of alcohol     Types: 6 Glasses of wine per week    Comment: occasional   Drug use: No   Sexual activity: Yes    Birth control/protection: Post-menopausal  Other Topics Concern   Not on file  Social History Narrative   Not on file   Social Drivers of Health   Financial Resource Strain: Not on file  Food Insecurity: Not on file  Transportation Needs: Not on file  Physical Activity: Not on file  Stress: Not on file  Social Connections: Not on file  Intimate Partner Violence: Not on file    Review of Systems:  All other review of systems negative except as mentioned in the HPI.  Physical Exam: Vital signs There were no vitals taken for this visit.  General:   Alert,  Well-developed, well-nourished, pleasant and cooperative in  NAD Airway:  Mallampati  Lungs:  Clear throughout to auscultation.   Heart:  Regular rate and rhythm; no murmurs, clicks, rubs,  or gallops. Abdomen:  Soft, nontender and nondistended. Normal bowel sounds.   Neuro/Psych:  Normal mood and affect. A and O x 3  Inocente Hausen, MD [facility]

## 2024-07-08 ENCOUNTER — Ambulatory Visit (AMBULATORY_SURGERY_CENTER): Admitting: Pediatrics

## 2024-07-08 ENCOUNTER — Encounter: Payer: Self-pay | Admitting: Pediatrics

## 2024-07-08 VITALS — BP 130/76 | HR 77 | Temp 98.2°F | Resp 14 | Ht 65.0 in | Wt 160.0 lb

## 2024-07-08 DIAGNOSIS — K649 Unspecified hemorrhoids: Secondary | ICD-10-CM | POA: Diagnosis not present

## 2024-07-08 DIAGNOSIS — Z1211 Encounter for screening for malignant neoplasm of colon: Secondary | ICD-10-CM

## 2024-07-08 DIAGNOSIS — D123 Benign neoplasm of transverse colon: Secondary | ICD-10-CM | POA: Diagnosis not present

## 2024-07-08 DIAGNOSIS — K5730 Diverticulosis of large intestine without perforation or abscess without bleeding: Secondary | ICD-10-CM

## 2024-07-08 DIAGNOSIS — E785 Hyperlipidemia, unspecified: Secondary | ICD-10-CM | POA: Diagnosis not present

## 2024-07-08 DIAGNOSIS — K635 Polyp of colon: Secondary | ICD-10-CM | POA: Diagnosis not present

## 2024-07-08 DIAGNOSIS — D122 Benign neoplasm of ascending colon: Secondary | ICD-10-CM

## 2024-07-08 DIAGNOSIS — K648 Other hemorrhoids: Secondary | ICD-10-CM

## 2024-07-08 MED ORDER — SODIUM CHLORIDE 0.9 % IV SOLN: 500.0000 mL | INTRAVENOUS | Status: AC

## 2024-07-08 NOTE — Op Note (Signed)
 [facility] Patient Name: Molly Vance Procedure Date: 07/08/2024 11:26 AM MRN: [id number] Endoscopist: Inocente Hausen , MD, [id number] Age: 63 Referring MD:  Date of Birth: 09-17-1960 Gender: Female Account #: [id number] Procedure:                Colonoscopy Indications:              Screening for colorectal malignant neoplasm, Last                            colonoscopy: 2015 Medicines:                Monitored Anesthesia Care Procedure:                Pre-Anesthesia Assessment:                           - Prior to the procedure, a History and Physical                            was performed, and patient medications and                            allergies were reviewed. The patient's tolerance of                            previous anesthesia was also reviewed. The risks                            and benefits of the procedure and the sedation                            options and risks were discussed with the patient.                            All questions were answered, and informed consent                            was obtained. Prior Anticoagulants: The patient has                            taken no anticoagulant or antiplatelet agents. ASA                            Grade Assessment: II - A patient with mild systemic                            disease. After reviewing the risks and benefits,                            the patient was deemed in satisfactory condition to                            undergo the procedure.  After obtaining informed consent, the colonoscope                            was passed under direct vision. Throughout the                            procedure, the patient's blood pressure, pulse, and                            oxygen saturations were monitored continuously. The                            Olympus CF-HQ190L ([id number]) Colonoscope was                            introduced through the anus and advanced to  the                            cecum, identified by appendiceal orifice and                            ileocecal valve. The colonoscopy was somewhat                            difficult due to significant looping and a tortuous                            colon. There was also presence of spasm during                            withdrawal limiting views of the ascending colon                            and transverse colon. Successful completion of the                            procedure was aided by straightening and shortening                            the scope to obtain bowel loop reduction. The                            patient tolerated the procedure well. The quality                            of the bowel preparation was adequate to identify                            polyps greater than 5 mm in size. The ileocecal                            valve, appendiceal orifice, and rectum were  photographed. Scope In: 11:37:06 AM Scope Out: 12:00:30 PM Scope Withdrawal Time: 0 hours 15 minutes 12 seconds  Total Procedure Duration: 0 hours 23 minutes 24 seconds  Findings:                 Hemorrhoids were found on perianal exam.                           The digital rectal exam was normal. Pertinent                            negatives include normal sphincter tone and no                            palpable rectal lesions.                           Multiple small-mouthed diverticula were found in                            the sigmoid colon and descending colon.                           Two sessile polyps were found in the hepatic                            flexure and ascending colon. The polyps were 5 to 7                            mm in size. These polyps were removed with a cold                            snare. Resection and retrieval were complete.                           Internal hemorrhoids were found during retroflexion. Complications:            No  immediate complications. Estimated blood loss:                            Minimal. Estimated Blood Loss:     Estimated blood loss was minimal. Impression:               - Hemorrhoids found on perianal exam.                           - Diverticulosis in the sigmoid colon and in the                            descending colon.                           - Two 5 to 7 mm polyps at the hepatic flexure and                            in the ascending colon,  removed with a cold snare.                            Resected and retrieved.                           - Internal hemorrhoids. Recommendation:           - Discharge patient to home (ambulatory).                           - Await pathology results.                           - Repeat colonoscopy for surveillance based on                            pathology results. If pathology reveals                            hyperplastic or adenomatous polyps, recommend                            repeat colonoscopy in 5 years given tortuous colon                            and spasm during withdrawal limiting views of                            ascending colon and transverse colon.                           - The findings and recommendations were discussed                            with the patient's family.                           - Patient has a contact number available for                            emergencies. The signs and symptoms of potential                            delayed complications were discussed with the                            patient. Return to normal activities tomorrow.                            Written discharge instructions were provided to the                            patient. Inocente Hausen, MD 07/08/2024 12:05:52 PM This report has been signed electronically.

## 2024-07-08 NOTE — Progress Notes (Signed)
 Report to PACU, RN, vss, BBS= Clear.

## 2024-07-08 NOTE — Patient Instructions (Signed)
 YOU HAD AN ENDOSCOPIC PROCEDURE TODAY AT THE [facility]:   Refer to the procedure report that was given to you for any specific questions about what was found during the examination.  If the procedure report does not answer your questions, please call your gastroenterologist to clarify.  If you requested that your care partner not be given the details of your procedure findings, then the procedure report has been included in a sealed envelope for you to review at your convenience later.  YOU SHOULD EXPECT: Some feelings of bloating in the abdomen. Passage of more gas than usual.  Walking can help get rid of the air that was put into your GI tract during the procedure and reduce the bloating. If you had a lower endoscopy (such as a colonoscopy or flexible sigmoidoscopy) you may notice spotting of blood in your stool or on the toilet paper. If you underwent a bowel prep for your procedure, you may not have a normal bowel movement for a few days.  Please Note:  You might notice some irritation and congestion in your nose or some drainage.  This is from the oxygen used during your procedure.  There is no need for concern and it should clear up in a day or so.  SYMPTOMS TO REPORT IMMEDIATELY:  Following lower endoscopy (colonoscopy or flexible sigmoidoscopy):  Excessive amounts of blood in the stool  Significant tenderness or worsening of abdominal pains  Swelling of the abdomen that is new, acute  Fever of 100F or higher  Resume previous diet Await pathology results Handouts on polyps, diverticulosis and hemorrhoids given   For urgent or emergent issues, a gastroenterologist can be reached at any hour by calling [phone]. Do not use MyChart messaging for urgent concerns.    DIET:  We do recommend a small meal at first, but then you may proceed to your regular diet.  Drink plenty of fluids but you should avoid alcoholic beverages for 24 hours.  ACTIVITY:  You should plan to  take it easy for the rest of today and you should NOT DRIVE or use heavy machinery until tomorrow (because of the sedation medicines used during the test).    FOLLOW UP: Our staff will call the number listed on your records the next business day following your procedure.  We will call around 7:15- 8:00 am to check on you and address any questions or concerns that you may have regarding the information given to you following your procedure. If we do not reach you, we will leave a message.     If any biopsies were taken you will be contacted by phone or by letter within the next 1-3 weeks.  Please call us  at [phone] if you have not heard about the biopsies in 3 weeks.    SIGNATURES/CONFIDENTIALITY: You and/or your care partner have signed paperwork which will be entered into your electronic medical record.  These signatures attest to the fact that that the information above on your After Visit Summary has been reviewed and is understood.  Full responsibility of the confidentiality of this discharge information lies with you and/or your care-partner.

## 2024-07-08 NOTE — Progress Notes (Signed)
 Called to room to assist during endoscopic procedure.  Patient ID and intended procedure confirmed with present staff. Received instructions for my participation in the procedure from the performing physician.

## 2024-07-08 NOTE — Progress Notes (Signed)
 Pt's states no medical or surgical changes since previsit or office visit.

## 2024-07-09 ENCOUNTER — Telehealth: Payer: Self-pay | Admitting: *Deleted

## 2024-07-09 NOTE — Telephone Encounter (Signed)
 No answer post call. Left a message.

## 2024-07-11 ENCOUNTER — Ambulatory Visit: Payer: Self-pay | Admitting: Pediatrics

## 2024-07-11 LAB — SURGICAL PATHOLOGY

## 2024-07-15 ENCOUNTER — Other Ambulatory Visit (HOSPITAL_COMMUNITY): Payer: Self-pay

## 2024-07-16 ENCOUNTER — Other Ambulatory Visit (HOSPITAL_COMMUNITY): Payer: Self-pay

## 2024-07-16 ENCOUNTER — Other Ambulatory Visit: Payer: Self-pay

## 2024-07-16 MED ORDER — ZOLPIDEM TARTRATE 5 MG PO TABS
5.0000 mg | ORAL_TABLET | Freq: Every evening | ORAL | 0 refills | Status: DC | PRN
  Filled 2024-07-16: qty 30, 30d supply, fill #0

## 2024-07-21 ENCOUNTER — Other Ambulatory Visit (HOSPITAL_COMMUNITY)
Admission: RE | Admit: 2024-07-21 | Discharge: 2024-07-21 | Disposition: A | Discharge status: Home / Self Care | Payer: Self-pay | Source: Ambulatory Visit | Attending: Medical Genetics | Admitting: Medical Genetics

## 2024-07-21 DIAGNOSIS — Z006 Encounter for examination for normal comparison and control in clinical research program: Secondary | ICD-10-CM | POA: Insufficient documentation

## 2024-07-30 ENCOUNTER — Other Ambulatory Visit (HOSPITAL_COMMUNITY): Payer: Self-pay

## 2024-08-04 LAB — GENECONNECT MOLECULAR SCREEN: Genetic Analysis Overall Interpretation: NEGATIVE

## 2024-08-11 ENCOUNTER — Other Ambulatory Visit (HOSPITAL_COMMUNITY): Payer: Self-pay

## 2024-08-12 ENCOUNTER — Other Ambulatory Visit: Payer: Self-pay

## 2024-08-12 ENCOUNTER — Other Ambulatory Visit (HOSPITAL_COMMUNITY): Payer: Self-pay

## 2024-08-12 MED ORDER — ROSUVASTATIN CALCIUM 5 MG PO TABS
5.0000 mg | ORAL_TABLET | Freq: Every day | ORAL | 3 refills | Status: AC
  Filled 2024-08-12: qty 90, 90d supply, fill #0

## 2024-09-06 ENCOUNTER — Other Ambulatory Visit (HOSPITAL_COMMUNITY): Payer: Self-pay

## 2024-09-07 ENCOUNTER — Other Ambulatory Visit (HOSPITAL_COMMUNITY): Payer: Self-pay

## 2024-09-08 ENCOUNTER — Other Ambulatory Visit (HOSPITAL_COMMUNITY): Payer: Self-pay

## 2024-09-08 MED ORDER — ZOLPIDEM TARTRATE 5 MG PO TABS
ORAL_TABLET | ORAL | 0 refills | Status: AC
  Filled 2024-09-08: qty 30, 30d supply, fill #0

## 2024-09-08 MED ORDER — CYCLOSPORINE 0.05 % OP EMUL
1.0000 [drp] | Freq: Two times a day (BID) | OPHTHALMIC | 3 refills | Status: AC
  Filled 2024-09-08: qty 180, 90d supply, fill #0

## 2024-09-09 ENCOUNTER — Other Ambulatory Visit: Payer: Self-pay

## 2024-09-09 ENCOUNTER — Encounter: Payer: Self-pay | Admitting: Pharmacist

## 2024-09-09 ENCOUNTER — Other Ambulatory Visit (HOSPITAL_COMMUNITY): Payer: Self-pay

## 2024-09-10 ENCOUNTER — Other Ambulatory Visit: Payer: Self-pay

## 2024-09-15 ENCOUNTER — Other Ambulatory Visit (HOSPITAL_COMMUNITY): Payer: Self-pay

## 2024-09-15 MED ORDER — CLONAZEPAM 0.5 MG PO TABS
0.5000 mg | ORAL_TABLET | Freq: Every evening | ORAL | 3 refills | Status: AC | PRN
  Filled 2024-09-15: qty 30, 30d supply, fill #0

## 2024-09-16 ENCOUNTER — Other Ambulatory Visit: Payer: Self-pay
